# Patient Record
Sex: Male | Born: 1955
Health system: Southern US, Community
[De-identification: ages and names within clinical notes are randomized; demographics above are authoritative.]

## PROBLEM LIST (undated history)

## (undated) DIAGNOSIS — M1990 Unspecified osteoarthritis, unspecified site: Secondary | ICD-10-CM

## (undated) DIAGNOSIS — G4733 Obstructive sleep apnea (adult) (pediatric): Secondary | ICD-10-CM

## (undated) DIAGNOSIS — R55 Syncope and collapse: Secondary | ICD-10-CM

## (undated) DIAGNOSIS — E785 Hyperlipidemia, unspecified: Secondary | ICD-10-CM

## (undated) DIAGNOSIS — H5210 Myopia, unspecified eye: Secondary | ICD-10-CM

## (undated) DIAGNOSIS — Z950 Presence of cardiac pacemaker: Secondary | ICD-10-CM

## (undated) DIAGNOSIS — R001 Bradycardia, unspecified: Secondary | ICD-10-CM

## (undated) DIAGNOSIS — G4730 Sleep apnea, unspecified: Secondary | ICD-10-CM

## (undated) DIAGNOSIS — C801 Malignant (primary) neoplasm, unspecified: Secondary | ICD-10-CM

## (undated) HISTORY — DX: Syncope and collapse: R55

## (undated) HISTORY — PX: MENISCUS REPAIR: SHX5179

## (undated) HISTORY — DX: Obstructive sleep apnea (adult) (pediatric): G47.33

## (undated) HISTORY — PX: HERNIA REPAIR: SHX51

## (undated) HISTORY — DX: Bradycardia, unspecified: R00.1

## (undated) HISTORY — DX: Sleep apnea, unspecified: G47.30

## (undated) HISTORY — DX: Myopia, unspecified eye: H52.10

## (undated) HISTORY — DX: Hyperlipidemia, unspecified: E78.5

---

## 2003-01-01 DIAGNOSIS — R001 Bradycardia, unspecified: Secondary | ICD-10-CM

## 2003-01-01 HISTORY — PX: PACEMAKER PLACEMENT: SHX43

## 2003-01-01 HISTORY — DX: Bradycardia, unspecified: R00.1

## 2006-11-08 DIAGNOSIS — R55 Syncope and collapse: Secondary | ICD-10-CM

## 2006-11-08 HISTORY — DX: Syncope and collapse: R55

## 2006-11-08 HISTORY — PX: CARDIAC CATHETERIZATION: SHX172

## 2007-01-17 ENCOUNTER — Inpatient Hospital Stay (HOSPITAL_COMMUNITY): Admission: EM | Admit: 2007-01-17 | Discharge: 2007-01-19 | Payer: Self-pay | Admitting: Emergency Medicine

## 2007-01-17 ENCOUNTER — Ambulatory Visit: Payer: Self-pay | Admitting: Internal Medicine

## 2007-01-18 ENCOUNTER — Encounter (INDEPENDENT_AMBULATORY_CARE_PROVIDER_SITE_OTHER): Payer: Self-pay | Admitting: *Deleted

## 2007-01-18 ENCOUNTER — Ambulatory Visit: Payer: Self-pay | Admitting: Vascular Surgery

## 2008-02-16 ENCOUNTER — Emergency Department (HOSPITAL_COMMUNITY): Admission: EM | Admit: 2008-02-16 | Discharge: 2008-02-16 | Payer: Self-pay | Admitting: Emergency Medicine

## 2008-02-20 ENCOUNTER — Ambulatory Visit (HOSPITAL_COMMUNITY): Admission: RE | Admit: 2008-02-20 | Discharge: 2008-02-21 | Payer: Self-pay | Admitting: Ophthalmology

## 2010-07-08 ENCOUNTER — Ambulatory Visit: Payer: Self-pay | Admitting: Cardiology

## 2010-10-26 ENCOUNTER — Encounter: Payer: Self-pay | Admitting: Internal Medicine

## 2010-11-12 ENCOUNTER — Ambulatory Visit
Admission: RE | Admit: 2010-11-12 | Discharge: 2010-11-12 | Payer: Self-pay | Source: Home / Self Care | Attending: Internal Medicine | Admitting: Internal Medicine

## 2010-11-12 ENCOUNTER — Encounter: Payer: Self-pay | Admitting: Internal Medicine

## 2010-12-10 NOTE — Miscellaneous (Signed)
Summary: Device preload  Clinical Lists Changes  Observations: Added new observation of PPM INDICATN: Sick sinus syndrome (10/26/2010 12:45) Added new observation of MAGNET RTE: BOL 85 ERI 65 (10/26/2010 12:45) Added new observation of PPMLEADSTAT2: active (10/26/2010 12:45) Added new observation of PPMLEADSER2: [id number] (10/26/2010 12:45) Added new observation of PPMLEADMOD2: 4092  (10/26/2010 12:45) Added new observation of PPMLEADDOI2: 02/04/2003  (10/26/2010 12:45) Added new observation of PPMLEADLOC2: RV  (10/26/2010 12:45) Added new observation of PPMLEADSTAT1: active  (10/26/2010 12:45) Added new observation of PPMLEADSER1: [id number]  (10/26/2010 12:45) Added new observation of PPMLEADMOD1: 4592  (10/26/2010 12:45) Added new observation of PPMLEADDOI1: 02/04/2003  (10/26/2010 12:45) Added new observation of PPMLEADLOC1: RA  (10/26/2010 12:45) Added new observation of PPM DOI: 02/04/2003  (10/26/2010 12:45) Added new observation of PPM SERL#: [id number]  (10/26/2010 12:45) Added new observation of PPM MODL#: [id number]  (10/26/2010 46:96) Added new observation of PACEMAKERMFG: Medtronic  (10/26/2010 12:45) Added new observation of PPM REFER MD: Duffy Rhody Tennant,MD  (10/26/2010 12:45) Added new observation of PACEMAKER MD: Hillis Range, MD  (10/26/2010 12:45)      PPM Specifications Following MD:  Hillis Range, MD     Referring MD:  Rolla Plate PPM Vendor:  Medtronic     PPM Model Number:  [id number]     PPM Serial Number:  [id number] PPM DOI:  02/04/2003      Lead 1    Location: RA     DOI: 02/04/2003     Model #: [id number]     Serial #: [id number]     Status: active Lead 2    Location: RV     DOI: 02/04/2003     Model #: [id number]     Serial #: [id number]     Status: active  Magnet Response Rate:  BOL 85 ERI 65  Indications:  Sick sinus syndrome

## 2010-12-10 NOTE — Cardiovascular Report (Signed)
Summary: Office Visit   Office Visit   Imported By: Roderic Ovens 11/17/2010 14:05:25  _____________________________________________________________________  External Attachment:    Type:   Image     Comment:   External Document
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
## 2011-02-15 ENCOUNTER — Telehealth: Payer: Self-pay | Admitting: Internal Medicine

## 2011-02-15 MED ORDER — ATORVASTATIN CALCIUM 10 MG PO TABS: 10.0000 mg | ORAL_TABLET | Freq: Every day | ORAL | Status: DC

## 2011-03-03 ENCOUNTER — Encounter: Payer: Self-pay | Admitting: Internal Medicine

## 2011-03-04 ENCOUNTER — Ambulatory Visit (INDEPENDENT_AMBULATORY_CARE_PROVIDER_SITE_OTHER): Payer: 59 | Admitting: Internal Medicine

## 2011-03-04 ENCOUNTER — Encounter: Payer: Self-pay | Admitting: Internal Medicine

## 2011-03-04 VITALS — BP 126/72 | HR 68 | Resp 17 | Ht 72.0 in | Wt 212.0 lb

## 2011-03-04 DIAGNOSIS — R001 Bradycardia, unspecified: Secondary | ICD-10-CM | POA: Insufficient documentation

## 2011-03-04 DIAGNOSIS — I498 Other specified cardiac arrhythmias: Secondary | ICD-10-CM

## 2011-03-04 DIAGNOSIS — I495 Sick sinus syndrome: Secondary | ICD-10-CM

## 2011-03-04 DIAGNOSIS — R55 Syncope and collapse: Secondary | ICD-10-CM

## 2011-03-04 MED ORDER — METOPROLOL SUCCINATE ER 50 MG PO TB24: 50.0000 mg | ORAL_TABLET | Freq: Every day | ORAL | Status: DC

## 2011-03-04 MED ORDER — PAROXETINE HCL 20 MG PO TABS: 20.0000 mg | ORAL_TABLET | ORAL | Status: DC

## 2011-03-04 MED ORDER — ATORVASTATIN CALCIUM 10 MG PO TABS: 10.0000 mg | ORAL_TABLET | Freq: Every day | ORAL | Status: DC

## 2011-03-04 NOTE — Assessment & Plan Note (Signed)
Due to dysautonomia.  No recurrence since 2008.  No changes at this time.

## 2011-03-04 NOTE — Assessment & Plan Note (Signed)
Normal pacemaker function See Arita Miss Art report His battery longevity is estimated to be 6 months.  We will therefore schedule carelink in 3 months and then monthly thereafter.  I will see him again in 6 months. No changes today

## 2011-03-04 NOTE — Patient Instructions (Signed)
Your physician wants you to follow-up in: 6 months with Dr Jacquiline Doe will receive a reminder letter in the mail two months in advance. If you don't receive a letter, please call our office to schedule the follow-up appointment.  Remote monitoring is used to monitor your Pacemaker of ICD from home. This monitoring reduces the number of office visits required to check your device to one time per year. It allows [place] to keep an eye on the functioning of your device to ensure it is working properly. You are scheduled for a device check from home on 06/03/11. You may send your transmission at any time that day. If you have a wireless device, the transmission will be sent automatically. After your physician reviews your transmission, you will receive a postcard with your next transmission date.

## 2011-03-04 NOTE — Progress Notes (Signed)
Tracy [name] is a pleasant 55 y.o. yo patient with a h/o dysautonomia, syncope, and bradycardia sp PPM (MDT) 01/01/03  who presents today to establish care in the Electrophysiology device clinic.  He has previously had syncope.  This was initially felt to be due to bradycardia and he underwent PPM implantation 2004.  He did well until 2008 when he developed recurrent syncope.  He was evaluated by Dr Graciela Husbands at that time.  Cath was unrevealing and he was felt to have dysautonomia as the source for the episode.  His medicine was adjusted and he has done very well since.  Today, he  denies symptoms of palpitations, chest pain, shortness of breath, orthopnea, PND, lower extremity edema, dizziness, presyncope, syncope, or neurologic sequela.  The patientis tolerating medications without difficulties and is otherwise without complaint today.   Past Medical History  Diagnosis Date  . Syncope 2008    dysautonomia  . Myopia   . Dyslipidemia   . Bradycardia 01/01/03    s/p PPM (MDT)    Past Surgical History  Procedure Date  . Pacemaker placement 01/01/03  . Cardiac catheterization 2008    no CAD    History   Social History  . Marital Status: Married    Spouse Name: N/A    Number of Children: N/A  . Years of Education: N/A   Occupational History  . Not on file.   Social History Main Topics  . Smoking status: Never Smoker   . Smokeless tobacco: Not on file  . Alcohol Use: No  . Drug Use: No  . Sexually Active: Not on file   Other Topics Concern  . Not on file   Social History Narrative  . No narrative on file    No family history on file.  No Known Allergies  Current Outpatient Prescriptions  Medication Sig Dispense Refill  . atorvastatin (LIPITOR) 10 MG tablet Take 1 tablet (10 mg total) by mouth daily.  30 tablet  11  . loperamide (IMODIUM) 2 MG capsule Take 2 mg by mouth 4 (four) times daily as needed.        . metoprolol (TOPROL-XL) 50 MG 24 hr tablet Take 50 mg by mouth daily.         . Multiple Vitamin (MULTIVITAMIN) capsule Take 1 capsule by mouth daily.        Marland Kitchen PARoxetine (PAXIL) 20 MG tablet Take 20 mg by mouth every morning.        Marland Kitchen DISCONTD: budesonide-formoterol (SYMBICORT) 80-4.5 MCG/ACT inhaler Inhale 2 puffs into the lungs 2 (two) times daily.        Marland Kitchen DISCONTD: oxyCODONE-acetaminophen (PERCOCET) 5-325 MG per tablet Take 1 tablet by mouth every 4 (four) hours as needed.          ROS- all systems are reviewed and negative except as per HPI  Physical Exam: Filed Vitals:   03/04/11 1031  BP: 126/72  Pulse: 68  Resp: 17  Height: 6' (1.829 m)  Weight: 212 lb (96.163 kg)    GEN- The patient is well appearing, alert and oriented x 3 today.   Head- normocephalic, atraumatic Eyes-  Sclera clear, conjunctiva pink Ears- hearing intact Oropharynx- clear Neck- supple, no JVP Lymph- no cervical lymphadenopathy Lungs- Clear to ausculation bilaterally, normal work of breathing Chest- R sided pacemaker pocket is well healed Heart- Regular rate and rhythm, no murmurs, rubs or gallops, PMI not laterally displaced GI- soft, NT, ND, + BS Extremities- no clubbing, cyanosis, or edema MS-  no significant deformity or atrophy Skin- no rash or lesion Psych- euthymic mood, full affect Neuro- strength and sensation are intact  Pacemaker interrogation- reviewed in detail today,  See PACEART report  Assessment and Plan:

## 2011-03-08 ENCOUNTER — Telehealth: Payer: Self-pay | Admitting: Internal Medicine

## 2011-03-08 NOTE — Telephone Encounter (Signed)
Pt on lipitor 10 mg once a day, insurance won't pay it that way, needs to change to lipitor 20mg  half a tab a day-[facility] [phone]

## 2011-03-10 ENCOUNTER — Other Ambulatory Visit: Payer: Self-pay | Admitting: *Deleted

## 2011-03-10 MED ORDER — ATORVASTATIN CALCIUM 20 MG PO TABS: ORAL_TABLET | ORAL | Status: DC

## 2011-03-23 NOTE — Op Note (Signed)
NAME:  Tracy [name], Tracy [name] NO.:  [id number]   MEDICAL RECORD NO.:  [id number]          PATIENT TYPE:  OIB   LOCATION:  5123                         FACILITY:  [facility]   PHYSICIAN:  Beulah Gandy. Ashley Royalty, M.D. DATE OF BIRTH:  05/28/1956   DATE OF PROCEDURE:  02/20/2008  DATE OF DISCHARGE:                               OPERATIVE REPORT   ADMISSION DIAGNOSIS:  Rhegmatogenous retinal detachment, right eye.   PROCEDURES:  Scleral buckle and pars plana vitrectomy with the 25-gauge  system gas-fluid exchange, indirect ophthalmoscope laser, right eye.   SURGEON:  Beulah Gandy. Ashley Royalty, M.D.   ASSISTANT:  Rosalie Doctor, M.A.   ANESTHESIA:  General.   DETAILS:  Usual prep and drape.  360-degree limbal peritomy.  Isolation  of 4 rectus muscles on 2-0 silk.  Scleral dissection per 360 degrees to  admit #279 implant.  508G radial segment was placed beneath the break at  11 o'clock.  Mayer Camel was placed in the bed, 2 sutures per quadrant for  total of 8 sutures were placed in scleral flaps.  Black staphylomas were  seen temporarily and these areas were avoided.  A 240 band placed around  the globe with a 270 sleeve at 2 o'clock.  Perforation site shows in the  10 o'clock in the posterior aspects of the bed, a moderate amount of  clear colorless subretinal fluid came forth the scleral buckle.  Scleral  sutures were drawn securely.  The buckle was adjusted and trimmed.  The  band was adjusted and trimmed.  The 508G segment was placed.  Attention  was then carried to the pars plana area, where a 25-gauge trocars were  placed at 8, 10, and 2 o'clock.  Provisc was placed on the corneal  surface and the Biom viewing system was moved in to place.  Pars plana  vitrectomy was begun just behind the cataractous lens.  Blood clots and  clumps were engaged and removed with the vitreous cutter.  The  vitrectomy was carried out to the vitreous base with 360 degrees.  The  area of retinal detachment with the  large retinal break was trimmed.  Vitreous was trimmed from the flap and a portion of the flap was removed  with the vitreous cutter.  All vitreous blood was removed and vitreous  was vacuumed from the surface of the retina with a silicone tip suction  line.  The endolaser was placed in the eye, 241 burns were placed around  the retinal break with the power of 1000 mV, 1000 microns each, and 0.1  seconds each.  Once all the vitreous and blood was removed, a gas-fluid  exchange was carried out until all fluid and blood was removed from the  vitreous cavity.  Sterile room air was inflated.  The instruments and  trocars were removed from the eye.  The break was well supported on this  scleral buckle.  The indirect ophthalmoscopy laser was moved into place,  185 burns were placed on the back side of the retinal break, power was  800 mV, 1000 microns each, and 0.1 seconds each.  All sutures were  knotted and the free ends removed.  The band and buckle were trimmed.  The conjunctiva was reposited with 7-0 chromic suture.  Polymyxin and  gentamicin were irrigated into [place] space.  Atropine solution was  applied.  Marcaine was injected  around the globe for postop pain.  Decadron 10 mg was injected to the  lower subconjunctival space.  Atropine solution was applied.  TobraDex  ophthalmic ointment, a patch and shield were placed.  The patient was  awakened and taken to recovery in satisfactory condition.      Beulah Gandy. Ashley Royalty, M.D.  Electronically Signed     JDM/MEDQ  D:  02/20/2008  T:  02/21/2008  Job:  [id number]
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
## 2011-04-30 ENCOUNTER — Ambulatory Visit (HOSPITAL_BASED_OUTPATIENT_CLINIC_OR_DEPARTMENT_OTHER)
Admission: RE | Admit: 2011-04-30 | Discharge: 2011-04-30 | Disposition: A | Discharge status: Home / Self Care | Payer: 59 | Source: Ambulatory Visit | Attending: Orthopedic Surgery | Admitting: Orthopedic Surgery

## 2011-04-30 DIAGNOSIS — M2340 Loose body in knee, unspecified knee: Secondary | ICD-10-CM | POA: Insufficient documentation

## 2011-04-30 DIAGNOSIS — M2240 Chondromalacia patellae, unspecified knee: Secondary | ICD-10-CM | POA: Insufficient documentation

## 2011-04-30 DIAGNOSIS — Z01810 Encounter for preprocedural cardiovascular examination: Secondary | ICD-10-CM | POA: Insufficient documentation

## 2011-04-30 DIAGNOSIS — M23305 Other meniscus derangements, unspecified medial meniscus, unspecified knee: Secondary | ICD-10-CM | POA: Insufficient documentation

## 2011-04-30 LAB — POCT I-STAT, CHEM 8
BUN: 14 mg/dL (ref 6–23)
Calcium, Ion: 1.09 mmol/L — ABNORMAL LOW (ref 1.12–1.32)
Chloride: 102 mEq/L (ref 96–112)
Creatinine, Ser: 0.9 mg/dL (ref 0.50–1.35)
Glucose, Bld: 122 mg/dL — ABNORMAL HIGH (ref 70–99)
HCT: 45 % (ref 39.0–52.0)
Hemoglobin: 15.3 g/dL (ref 13.0–17.0)
Potassium: 3.8 mEq/L (ref 3.5–5.1)
Sodium: 139 mEq/L (ref 135–145)
TCO2: 27 mmol/L (ref 0–100)

## 2011-05-26 NOTE — Op Note (Signed)
NAME:  ABDALLA, NARAMORE NO.:  [id number]  MEDICAL RECORD NO.:  [id number]  LOCATION:                                 FACILITY:  PHYSICIAN:  Harvie Junior, M.D.        DATE OF BIRTH:  DATE OF PROCEDURE:  04/30/2011 DATE OF DISCHARGE:                              OPERATIVE REPORT   PREOPERATIVE DIAGNOSIS:  Medial meniscal tear.  POSTOPERATIVE DIAGNOSES: 1. Medial meniscal tear. 2. Chondromalacia, medial compartment. 3. Chondromalacia, patellofemoral compartment. 4. Multiple cartilaginous loose bodies.  PRINCIPAL PROCEDURE: 1. Operative knee arthroscopy with partial medial meniscectomy with     corresponding debridement in the medial compartment of grade 3 and     grade 4 change on the medial femoral condyle and medial tibial     plateau. 2. Debridement of chondromalacia patellofemoral joint down the     bleeding bone. 3. Removal of multiple cartilaginous loose bodies.  SURGEON:  Harvie Junior, MD.  ASSISTANT:  Marshia Ly, PA.  ANESTHESIA:  General.  BRIEF HISTORY:  Mr. Flatt is a 55 year old male, otherwise healthy, who was having significant complaints of left knee pain.  We have treated him conservatively for a period of time with injection therapy, activity modification, because of failure of all conservative care, he was taken to the operating room for operative knee arthroscopy.  DESCRIPTION OF PROCEDURE:  The patient was taken to the operating room. After adequate anesthesia was obtained with general anesthetic, the patient was placed supine on the operating table.  Left leg was prepped and draped in usual sterile fashion.  Following routine arthroscopic examination of the knee revealed, there was an obvious medial meniscal tear in the midbody and around posteriorly.  This was debrided back to a smooth and stable rim.  There was grade 4 change on the tibial plateau and only a several millimeter area which was debrided.  There  was significant chondromalacia in the medial femoral condyle.  During the case, multiple cartilaginous loose bodies were identified in the knee and these were removed with suction shaver throughout the case.  Once the medial compartment had been cleaned up, attention was turned to the ACL normal lateral side that had no evidence of meniscal pathology or chondral pathology, but there was significant cartilaginous loose bodies in the knee, we suctioned out here as well.  Attention was turned back to patellofemoral joint, which was debrided thoroughly, but down to bleeding bone and some areas of significant chondromalacia in this area, so I went up into the pouch and put 6 liters of normal saline irrigation through the knee to irrigate it out fully.  Once this was done, the knee was copious and thoroughly lavage suctioned dry.  All subportals closed with bandage.  Sterile compression was applied after 20 mL of 0.25% Marcaine had been instilled in the knee for postoperative anesthesia.  Once the bandage was applied, the patient was taken to recovery room and noted to be in satisfactory condition. Estimated blood loss of procedure none.     Harvie Junior, M.D.     Ranae Plumber  D:  04/30/2011  T:  05/01/2011  Job:  [id number]  Electronically Signed by Jodi Geralds M.D. on 05/26/2011 09:06:10 PM

## 2011-06-03 ENCOUNTER — Encounter: Payer: 59 | Admitting: *Deleted

## 2011-06-08 ENCOUNTER — Encounter: Payer: Self-pay | Admitting: *Deleted

## 2011-07-10 DIAGNOSIS — G4730 Sleep apnea, unspecified: Secondary | ICD-10-CM

## 2011-07-10 HISTORY — DX: Sleep apnea, unspecified: G47.30

## 2011-07-13 ENCOUNTER — Encounter: Payer: Self-pay | Admitting: Internal Medicine

## 2011-07-13 ENCOUNTER — Ambulatory Visit (INDEPENDENT_AMBULATORY_CARE_PROVIDER_SITE_OTHER): Payer: 59 | Admitting: *Deleted

## 2011-07-13 ENCOUNTER — Other Ambulatory Visit: Payer: Self-pay

## 2011-07-13 DIAGNOSIS — R001 Bradycardia, unspecified: Secondary | ICD-10-CM

## 2011-07-13 DIAGNOSIS — R55 Syncope and collapse: Secondary | ICD-10-CM

## 2011-07-13 DIAGNOSIS — I498 Other specified cardiac arrhythmias: Secondary | ICD-10-CM

## 2011-07-16 LAB — REMOTE PACEMAKER DEVICE
BATTERY VOLTAGE: 2.59 V
BMOD-0001RV: LOW
BMOD-0003RV: 30
BMOD-0005RV: 95 {beats}/min
BRDY-0002RV: 65 {beats}/min
RV LEAD IMPEDENCE PM: 666 Ohm
VENTRICULAR PACING PM: 1

## 2011-07-21 ENCOUNTER — Encounter: Payer: Self-pay | Admitting: Internal Medicine

## 2011-07-21 ENCOUNTER — Ambulatory Visit (INDEPENDENT_AMBULATORY_CARE_PROVIDER_SITE_OTHER): Payer: 59 | Admitting: Internal Medicine

## 2011-07-21 ENCOUNTER — Encounter: Payer: Self-pay | Admitting: *Deleted

## 2011-07-21 VITALS — BP 132/80 | HR 80 | Ht 72.0 in | Wt 214.0 lb

## 2011-07-21 DIAGNOSIS — I4891 Unspecified atrial fibrillation: Secondary | ICD-10-CM

## 2011-07-21 DIAGNOSIS — G4733 Obstructive sleep apnea (adult) (pediatric): Secondary | ICD-10-CM | POA: Insufficient documentation

## 2011-07-21 DIAGNOSIS — G4730 Sleep apnea, unspecified: Secondary | ICD-10-CM

## 2011-07-21 HISTORY — DX: Obstructive sleep apnea (adult) (pediatric): G47.33

## 2011-07-21 LAB — CBC WITH DIFFERENTIAL/PLATELET
Basophils Absolute: 0 10*3/uL (ref 0.0–0.1)
Basophils Relative: 0.4 % (ref 0.0–3.0)
Eosinophils Absolute: 0.1 10*3/uL (ref 0.0–0.7)
Eosinophils Relative: 1.6 % (ref 0.0–5.0)
HCT: 39.8 % (ref 39.0–52.0)
Hemoglobin: 13.1 g/dL (ref 13.0–17.0)
Lymphocytes Relative: 18.8 % (ref 12.0–46.0)
Lymphs Abs: 1.5 10*3/uL (ref 0.7–4.0)
MCHC: 33 g/dL (ref 30.0–36.0)
MCV: 100.3 fl — ABNORMAL HIGH (ref 78.0–100.0)
Monocytes Absolute: 0.5 10*3/uL (ref 0.1–1.0)
Monocytes Relative: 6.7 % (ref 3.0–12.0)
Neutro Abs: 5.9 10*3/uL (ref 1.4–7.7)
Neutrophils Relative %: 72.5 % (ref 43.0–77.0)
Platelets: 219 10*3/uL (ref 150.0–400.0)
RBC: 3.97 Mil/uL — ABNORMAL LOW (ref 4.22–5.81)
RDW: 13.3 % (ref 11.5–14.6)
WBC: 8.1 10*3/uL (ref 4.5–10.5)

## 2011-07-21 LAB — BASIC METABOLIC PANEL
BUN: 13 mg/dL (ref 6–23)
CO2: 30 mEq/L (ref 19–32)
Calcium: 8.8 mg/dL (ref 8.4–10.5)
Chloride: 102 mEq/L (ref 96–112)
Creatinine, Ser: 1 mg/dL (ref 0.4–1.5)
GFR: 84.51 mL/min (ref >60.00)
Glucose, Bld: 148 mg/dL — ABNORMAL HIGH (ref 70–99)
Potassium: 3.8 mEq/L (ref 3.5–5.1)
Sodium: 141 mEq/L (ref 135–145)

## 2011-07-21 NOTE — Assessment & Plan Note (Signed)
He states that he occasionally wakes with abrupt SOB and feels that he "quits breathing" during his sleep. WIll order a sleep study.

## 2011-07-21 NOTE — Assessment & Plan Note (Signed)
No recurrence. 

## 2011-07-21 NOTE — Assessment & Plan Note (Signed)
He has reached ERI battery status. Risks, benefits, and alternatives to PPM pulse generator replacement were discussed at length with the patient today.  Understands the risks and wishes to proceed.  We will therefore schedule PPM Generator change at the next available time.

## 2011-07-21 NOTE — Progress Notes (Signed)
Tracy [name] is a pleasant 55 y.o. WM patient with a h/o dysautonomia, syncope, and bradycardia sp PPM (MDT) 01/01/03  who presents for follow-up in the Electrophysiology device clinic.  He has previously had syncope.  This was initially felt to be due to bradycardia and he underwent PPM implantation 2004.  He did well until 2008 when he developed recurrent syncope.  He was evaluated by Dr Graciela Husbands at that time.  Cath was unrevealing and he was felt to have dysautonomia as the source for the episode.  His medicine was adjusted and he has done very well since.  Today, he  denies symptoms of palpitations, chest pain, shortness of breath, orthopnea, PND, lower extremity edema, dizziness, presyncope, syncope, or neurologic sequela.  His pacemaker has reached ERI battery status.  He reports mild fatigue since that time. The patientis tolerating medications without difficulties and is otherwise without complaint today.   Past Medical History  Diagnosis Date  . Syncope 2008    dysautonomia  . Myopia   . Dyslipidemia   . Bradycardia 01/01/03    s/p PPM (MDT)    Past Surgical History  Procedure Date  . Pacemaker placement 01/01/03  . Cardiac catheterization 2008    no CAD    History   Social History  . Marital Status: Married    Spouse Name: N/A    Number of Children: N/A  . Years of Education: N/A   Occupational History  . Not on file.   Social History Main Topics  . Smoking status: Never Smoker   . Smokeless tobacco: Not on file  . Alcohol Use: No  . Drug Use: No  . Sexually Active: Not on file   Other Topics Concern  . Not on file   Social History Narrative  . No narrative on file    No family history on file.  No Known Allergies  Current Outpatient Prescriptions  Medication Sig Dispense Refill  . atorvastatin (LIPITOR) 10 MG tablet Take 1 tablet (10 mg total) by mouth daily.  30 tablet  11  . loperamide (IMODIUM) 2 MG capsule Take 2 mg by mouth 4 (four) times daily as needed.         . metoprolol (TOPROL-XL) 50 MG 24 hr tablet Take 1 tablet (50 mg total) by mouth daily.  30 tablet  11  . Multiple Vitamin (MULTIVITAMIN) capsule Take 1 capsule by mouth daily.        Marland Kitchen PARoxetine (PAXIL) 20 MG tablet Take 1 tablet (20 mg total) by mouth every morning.  30 tablet  11    ROS- all systems are reviewed and negative except as per HPI  Physical Exam: Filed Vitals:   07/21/11 1208  BP: 132/80  Pulse: 80  Height: 6' (1.829 m)  Weight: 214 lb (97.07 kg)    GEN- The patient is well appearing, alert and oriented x 3 today.   Head- normocephalic, atraumatic Eyes-  Sclera clear, conjunctiva pink Ears- hearing intact Oropharynx- clear Neck- supple, no JVP Lymph- no cervical lymphadenopathy Lungs- Clear to ausculation bilaterally, normal work of breathing Chest- R sided pacemaker pocket is well healed Heart- Regular rate and rhythm, no murmurs, rubs or gallops, PMI not laterally displaced GI- soft, NT, ND, + BS Extremities- no clubbing, cyanosis, or edema MS- no significant deformity or atrophy Skin- no rash or lesion Psych- euthymic mood, full affect Neuro- strength and sensation are intact  Pacemaker interrogation- reviewed in detail today,  See PACEART report  Assessment and Plan:

## 2011-07-26 NOTE — Progress Notes (Signed)
Pacer remote check  

## 2011-07-27 ENCOUNTER — Other Ambulatory Visit: Payer: Self-pay | Admitting: Internal Medicine

## 2011-07-27 ENCOUNTER — Ambulatory Visit (HOSPITAL_COMMUNITY)
Admission: RE | Admit: 2011-07-27 | Discharge: 2011-07-27 | Disposition: A | Discharge status: Home / Self Care | Payer: 59 | Source: Ambulatory Visit | Attending: Internal Medicine | Admitting: Internal Medicine

## 2011-07-27 DIAGNOSIS — Z01811 Encounter for preprocedural respiratory examination: Secondary | ICD-10-CM

## 2011-07-27 DIAGNOSIS — I495 Sick sinus syndrome: Secondary | ICD-10-CM

## 2011-07-27 DIAGNOSIS — Z01812 Encounter for preprocedural laboratory examination: Secondary | ICD-10-CM | POA: Insufficient documentation

## 2011-07-27 DIAGNOSIS — Z01810 Encounter for preprocedural cardiovascular examination: Secondary | ICD-10-CM | POA: Insufficient documentation

## 2011-07-27 DIAGNOSIS — Z01818 Encounter for other preprocedural examination: Secondary | ICD-10-CM | POA: Insufficient documentation

## 2011-07-27 DIAGNOSIS — Z45018 Encounter for adjustment and management of other part of cardiac pacemaker: Secondary | ICD-10-CM | POA: Insufficient documentation

## 2011-07-27 LAB — PROTIME-INR
INR: 0.95 (ref 0.00–1.49)
Prothrombin Time: 12.9 seconds (ref 11.6–15.2)

## 2011-07-27 LAB — SURGICAL PCR SCREEN
MRSA, PCR: NEGATIVE
Staphylococcus aureus: POSITIVE — AB

## 2011-07-27 LAB — APTT: aPTT: 30 seconds (ref 24–37)

## 2011-07-28 ENCOUNTER — Encounter: Payer: Self-pay | Admitting: *Deleted

## 2011-07-28 ENCOUNTER — Telehealth: Payer: Self-pay | Admitting: Internal Medicine

## 2011-07-28 NOTE — Telephone Encounter (Signed)
Pt wanting to letter email to him stating that pt cannot return to work until next week.  [email or web address]  Please return call to pt to discuss further.

## 2011-07-28 NOTE — Telephone Encounter (Signed)
Pt calling wanting a letter to work stating pt work restrictions. Pt would also like a note from MD that pt can stay out of work until the end of the week. Pt said if he goes to work there is no way to abide by the recommended restrictions. 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 Please return pt call to clarify/discuss further.

## 2011-07-28 NOTE — Telephone Encounter (Signed)
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  Needs note to be out of work through 08/02/11.  Also to say no lifting over 10lbs for the next 2 weeks  Fax [phone]  Atn: Candi Leash Heiser

## 2011-07-29 ENCOUNTER — Ambulatory Visit (HOSPITAL_BASED_OUTPATIENT_CLINIC_OR_DEPARTMENT_OTHER): Payer: 59 | Attending: Internal Medicine

## 2011-07-29 ENCOUNTER — Encounter: Payer: Self-pay | Admitting: *Deleted

## 2011-07-29 DIAGNOSIS — G4733 Obstructive sleep apnea (adult) (pediatric): Secondary | ICD-10-CM | POA: Insufficient documentation

## 2011-07-29 DIAGNOSIS — I4891 Unspecified atrial fibrillation: Secondary | ICD-10-CM

## 2011-07-29 DIAGNOSIS — G4737 Central sleep apnea in conditions classified elsewhere: Secondary | ICD-10-CM | POA: Insufficient documentation

## 2011-07-29 DIAGNOSIS — I4949 Other premature depolarization: Secondary | ICD-10-CM | POA: Insufficient documentation

## 2011-07-30 ENCOUNTER — Encounter: Payer: Self-pay | Admitting: *Deleted

## 2011-08-03 LAB — DIFFERENTIAL
Basophils Absolute: 0
Basophils Relative: 0
Eosinophils Absolute: 0.4
Eosinophils Relative: 6 — ABNORMAL HIGH
Lymphocytes Relative: 27
Lymphs Abs: 1.7
Monocytes Absolute: 0.5
Monocytes Relative: 8
Neutro Abs: 3.8
Neutrophils Relative %: 59

## 2011-08-03 LAB — CBC
HCT: 40.4
HCT: 41.9
Hemoglobin: 13.7
Hemoglobin: 14.5
MCHC: 34
MCHC: 34.5
MCV: 95.2
MCV: 95.8
Platelets: 233
Platelets: 238
RBC: 4.22
RBC: 4.4
RDW: 12.5
RDW: 12.7
WBC: 6.2
WBC: 6.4

## 2011-08-03 LAB — POCT I-STAT, CHEM 8
BUN: 18
Calcium, Ion: 1.14
Chloride: 103
Creatinine, Ser: 1
Glucose, Bld: 110 — ABNORMAL HIGH
HCT: 43
Hemoglobin: 14.6
Potassium: 4
Sodium: 138
TCO2: 27

## 2011-08-03 LAB — BASIC METABOLIC PANEL
BUN: 10
CO2: 28
Calcium: 9.6
Chloride: 103
Creatinine, Ser: 0.78
GFR calc Af Amer: >60
GFR calc non Af Amer: >60
Glucose, Bld: 108 — ABNORMAL HIGH
Potassium: 4.6
Sodium: 140

## 2011-08-04 ENCOUNTER — Ambulatory Visit (INDEPENDENT_AMBULATORY_CARE_PROVIDER_SITE_OTHER): Payer: 59 | Admitting: *Deleted

## 2011-08-04 ENCOUNTER — Encounter: Payer: Self-pay | Admitting: Internal Medicine

## 2011-08-04 DIAGNOSIS — R001 Bradycardia, unspecified: Secondary | ICD-10-CM

## 2011-08-04 DIAGNOSIS — R55 Syncope and collapse: Secondary | ICD-10-CM

## 2011-08-04 DIAGNOSIS — I498 Other specified cardiac arrhythmias: Secondary | ICD-10-CM

## 2011-08-04 LAB — PACEMAKER DEVICE OBSERVATION
AL AMPLITUDE: 4 mv
AL IMPEDENCE PM: 557 Ohm
AL THRESHOLD: 0.5 V
ATRIAL PACING PM: 5
BATTERY VOLTAGE: 2.8 V
RV LEAD AMPLITUDE: 11.2 mv
RV LEAD IMPEDENCE PM: 615 Ohm
RV LEAD THRESHOLD: 1 V
VENTRICULAR PACING PM: 5

## 2011-08-04 NOTE — Progress Notes (Signed)
Wound check-PPM 

## 2011-08-05 ENCOUNTER — Ambulatory Visit: Payer: 59 | Admitting: *Deleted

## 2011-08-07 DIAGNOSIS — G4733 Obstructive sleep apnea (adult) (pediatric): Secondary | ICD-10-CM

## 2011-08-07 DIAGNOSIS — G4737 Central sleep apnea in conditions classified elsewhere: Secondary | ICD-10-CM

## 2011-08-07 NOTE — Procedures (Signed)
NAME:  Tracy [name], Tracy [name] NO.:  [id number]  MEDICAL RECORD NO.:  [id number]          PATIENT TYPE:  OUT  LOCATION:  SLEEP CENTER                 FACILITY:  [facility]  PHYSICIAN:  Barbaraann Share, MD,FCCPDATE OF BIRTH:  09/24/1956  DATE OF STUDY:  07/29/2011                           NOCTURNAL POLYSOMNOGRAM  REFERRING PHYSICIAN:  Hillis Range, MD  INDICATION FOR STUDY:  Hypersomnia with sleep apnea.  EPWORTH SLEEPINESS SCORE:  Epworth score:  16.  MEDICATIONS:  SLEEP ARCHITECTURE:  The patient had a total sleep time of 349 minutes with no slow wave sleep and adequate REM only during the titration portion of the study.  Sleep onset latency was normal at 8 minutes, and REM was not achieved during the diagnostic portion of the study.  Sleep efficiency was 83% during the diagnostic portion and 90% during the titration portion.  RESPIRATORY DATA:  The patient underwent a split night protocol where he was found to have 197 obstructive and central events in the first 135 minutes of sleep.  This gave him an apnea/hypopnea index of 88 events per hour during the diagnostic portion of the study.  The patient's events occurred in all body positions and there was very loud snoring noted throughout.  By protocol, he was fitted with a medium ResMed Quattro full-face mask, and CPAP titration was initiated.  At an optimal pressure of 16 CWP, the patient appeared to have good control of his events and also snoring.  OXYGEN DATA:  There was O2 desaturation as low as 70% with the patient's obstructive events.  At the optimal CPAP, the patient had no significant desaturation.  CARDIAC DATA:  Frequent PVCs noted.  MOVEMENT-PARASOMNIA:  The patient was found to have large numbers of leg jerks but very little awakening or arousal.  These appeared to be concentrated during one particular time period, and did not appear to be related to a primary movement disorder of  sleep.  IMPRESSIONS-RECOMMENDATIONS: 1. Split night study reveals severe obstructive and central sleep     apnea with an AHI of 88 events per hour and O2 desaturation as low     as 70% during the diagnostic portion of the study.  The patient was     then fitted with a medium ResMed Quattro full-face mask, and had     excellent control of his obstructive events with a pressure of     16 CWP.  The patient should also be encouraged to work on modest     weight loss. 2. Frequent PVCs noted.     Barbaraann Share, MD,FCCP Diplomate, American Board of Sleep Medicine Electronically Signed    KMC/MEDQ  D:  08/07/2011 13:32:28  T:  08/07/2011 13:49:11  Job:  [id number]

## 2011-08-12 NOTE — Progress Notes (Signed)
Addended by: Dennis Bast F on: 08/12/2011 01:18 PM   Modules accepted: Orders

## 2011-08-12 NOTE — Op Note (Signed)
NAME:  Tracy [name], BOSSMAN NO.:  [id number]  MEDICAL RECORD NO.:  [id number]  LOCATION:                                 FACILITY:  PHYSICIAN:  Hillis Range, MD       DATE OF BIRTH:  06-15-1956  DATE OF PROCEDURE: DATE OF DISCHARGE:                              OPERATIVE REPORT   SURGEON:  Hillis Range, MD  PREPROCEDURE DIAGNOSES: 1. Sick sinus syndrome with syncope. 2. Pacemaker at elective replacement indicator.  POSTPROCEDURE DIAGNOSES: 1. Sick sinus syndrome with syncope. 2. Pacemaker at elective replacement indicator.  PROCEDURES:  Pacemaker pulse generator replacement with skin pocket revision.  INTRODUCTION:  Mr. Age is a pleasant 55 year old gentleman with a history of dysautonomia, sick sinus syndrome, and syncope.  He is status post pacemaker implantation in 2004 following recurrent syncope.  He has done very well since his pacemaker was implanted.  Recently, he has reached ERI battery status.  He therefore presents today for pacemaker pulse generator replacement.  DESCRIPTION OF PROCEDURE:  Informed written consent was obtained, and the patient was brought to the electrophysiology lab in the fasting state.  His pacemaker was interrogated and confirmed to be at [facility] battery status.  He was adequately sedated with intravenous Versed as outlined in the nursing report.  The patient's right chest was prepped and draped in the usual sterile fashion by the EP lab staff.  The skin overlying his existing pacemaker was infiltrated with lidocaine for local analgesia.  A 4-cm incision was made over the existing pacemaker pocket.  Using a combination of sharp and blunt dissection, the pacemaker was exposed and removed from the body.  There was no foreign matter or debris within the pocket.  The pacemaker was disconnected from the leads.  The atrial lead was confirmed to be a Medtronic model 4592- 45 (serial number [id number]) lead implanted on February 04, 2003.  The right ventricular lead was confirmed to be a Medtronic model [id number] (serial number [id number]) lead also implanted on February 04, 2003.  Both leads were examined thoroughly and their integrity was confirmed to be intact.  Atrial lead P-waves measured 5 mV with an impedance of 480 ohms and a threshold of 0.4 V at 0.5 msec.  Right ventricular lead R-waves measured 9 mV with an impedance of 539 ohms and a threshold of 0.8 V at 0.5 msec.  Both leads were connected to a Medtronic Adapta L, model ADDRL1 (serial number [id number]) pacemaker.  The pocket was revised to accommodate this new device.  Electrocautery was required to assure hemostasis.  The pacemaker was then placed into the pocket.  The pocket was then closed in 2 layers with 2.0 Vicryl suture for the subcutaneous and subcuticular layers.  Steri-Strips and a sterile dressing were then applied.  There were no early apparent complications.  CONCLUSIONS: 1. Successful pacemaker pulse generator replacement for  elective     replacement indicator battery status. 2. No early apparent complications.     Hillis Range, MD     JA/MEDQ  D:  07/27/2011  T:  07/27/2011  Job:  [id number]  Electronically Signed by Hillis Range MD on 08/12/2011 08:44:23 AM

## 2011-08-13 ENCOUNTER — Telehealth: Payer: Self-pay | Admitting: Internal Medicine

## 2011-08-13 NOTE — Telephone Encounter (Addendum)
Walk In Pt Form " Pt Dropped off [employer] Papers to be completed" Placed in Allred's Box for Completion Tresa Endo off Friday 08/13/11)   08/13/11/km  Dr.Allred completed [employer] papers for PT called pt for pick up  08/24/11/km

## 2011-08-16 ENCOUNTER — Ambulatory Visit: Payer: 59 | Admitting: Internal Medicine

## 2011-10-26 ENCOUNTER — Encounter: Payer: 59 | Admitting: Internal Medicine

## 2011-11-04 ENCOUNTER — Encounter: Payer: 59 | Admitting: Internal Medicine

## 2011-12-02 ENCOUNTER — Encounter: Payer: Self-pay | Admitting: *Deleted

## 2011-12-15 ENCOUNTER — Ambulatory Visit (INDEPENDENT_AMBULATORY_CARE_PROVIDER_SITE_OTHER): Payer: 59 | Admitting: *Deleted

## 2011-12-15 ENCOUNTER — Encounter: Payer: Self-pay | Admitting: Internal Medicine

## 2011-12-15 DIAGNOSIS — I498 Other specified cardiac arrhythmias: Secondary | ICD-10-CM

## 2011-12-15 DIAGNOSIS — R001 Bradycardia, unspecified: Secondary | ICD-10-CM

## 2011-12-19 LAB — REMOTE PACEMAKER DEVICE
AL AMPLITUDE: 2.8 mv
AL IMPEDENCE PM: 572 Ohm
ATRIAL PACING PM: 8
BATTERY VOLTAGE: 2.8 V
BRDY-0002RV: 40 {beats}/min
RV LEAD AMPLITUDE: 16 mv
RV LEAD IMPEDENCE PM: 671 Ohm
RV LEAD THRESHOLD: 0.625 V
VENTRICULAR PACING PM: 8

## 2011-12-24 NOTE — Progress Notes (Signed)
Pacer remote 

## 2011-12-27 ENCOUNTER — Encounter: Payer: Self-pay | Admitting: *Deleted

## 2012-01-07 ENCOUNTER — Encounter: Payer: Self-pay | Admitting: Internal Medicine

## 2012-01-07 ENCOUNTER — Ambulatory Visit (INDEPENDENT_AMBULATORY_CARE_PROVIDER_SITE_OTHER): Payer: 59 | Admitting: Internal Medicine

## 2012-01-07 DIAGNOSIS — R001 Bradycardia, unspecified: Secondary | ICD-10-CM

## 2012-01-07 DIAGNOSIS — R0681 Apnea, not elsewhere classified: Secondary | ICD-10-CM

## 2012-01-07 DIAGNOSIS — G4730 Sleep apnea, unspecified: Secondary | ICD-10-CM

## 2012-01-07 DIAGNOSIS — I498 Other specified cardiac arrhythmias: Secondary | ICD-10-CM

## 2012-01-07 DIAGNOSIS — R55 Syncope and collapse: Secondary | ICD-10-CM

## 2012-01-07 LAB — PACEMAKER DEVICE OBSERVATION
AL AMPLITUDE: 5.6 mv
AL IMPEDENCE PM: 537 Ohm
AL THRESHOLD: 0.5 V
ATRIAL PACING PM: 7
BATTERY VOLTAGE: 2.79 V
RV LEAD AMPLITUDE: 15.68 mv
RV LEAD IMPEDENCE PM: 657 Ohm
RV LEAD THRESHOLD: 0.75 V
VENTRICULAR PACING PM: 7

## 2012-01-07 MED ORDER — ATENOLOL 25 MG PO TABS: 25.0000 mg | ORAL_TABLET | Freq: Every day | ORAL | Status: DC

## 2012-01-07 NOTE — Patient Instructions (Addendum)
Your physician has requested that you have an echocardiogram. Echocardiography is a painless test that uses sound waves to create images of your heart. It provides your doctor with information about the size and shape of your heart and how well your heart's chambers and valves are working. This procedure takes approximately one hour. There are no restrictions for this procedure.  Your physician wants you to follow-up in: 1 year.  You will receive a reminder letter in the mail two months in advance. If you don't receive a letter, please call our office to schedule the follow-up appointment.  Your physician has recommended you make the following change in your medication: Atenolol 25mg  daily.  You have been referred to Dr. Marcelyn Bruins, MD-Pulmonologist

## 2012-01-09 ENCOUNTER — Encounter: Payer: Self-pay | Admitting: Internal Medicine

## 2012-01-09 NOTE — Progress Notes (Signed)
The patient presents today for routine electrophysiology followup.  Since his recent generator change, the patient reports doing very well.  Today, he denies symptoms of palpitations, chest pain, shortness of breath, orthopnea, PND, lower extremity edema, dizziness, presyncope, syncope, or neurologic sequela.  The patient feels that he is tolerating medications without difficulties and is otherwise without complaint today.   Past Medical History  Diagnosis Date  . Syncope 2008    dysautonomia  . Myopia   . Dyslipidemia   . Bradycardia 01/01/03    s/p PPM (MDT)   Past Surgical History  Procedure Date  . Pacemaker placement 01/01/03    generator change (MDT) by Dr Johney Frame 2012  . Cardiac catheterization 2008    no CAD    Current Outpatient Prescriptions  Medication Sig Dispense Refill  . atorvastatin (LIPITOR) 10 MG tablet Take 1 tablet (10 mg total) by mouth daily.  30 tablet  11  . loperamide (IMODIUM) 2 MG capsule Take 2 mg by mouth 4 (four) times daily as needed.        . metoprolol (TOPROL-XL) 50 MG 24 hr tablet Take 1 tablet (50 mg total) by mouth daily.  30 tablet  11  . Multiple Vitamin (MULTIVITAMIN) capsule Take 1 capsule by mouth daily.        Marland Kitchen PARoxetine (PAXIL) 20 MG tablet Take 1 tablet (20 mg total) by mouth every morning.  30 tablet  11  . atenolol (TENORMIN) 25 MG tablet Take 1 tablet (25 mg total) by mouth daily.  30 tablet  11    No Known Allergies  History   Social History  . Marital Status: Married    Spouse Name: N/A    Number of Children: N/A  . Years of Education: N/A   Occupational History  . Not on file.   Social History Main Topics  . Smoking status: Never Smoker   . Smokeless tobacco: Not on file  . Alcohol Use: No  . Drug Use: No  . Sexually Active: Not on file   Other Topics Concern  . Not on file   Social History Narrative  . No narrative on file    Physical Exam: Filed Vitals:   01/07/12 1503  BP: 136/80  Pulse: 77  Resp: 18    Height: 6' (1.829 m)  Weight: 217 lb 12.8 oz (98.793 kg)    GEN- The patient is well appearing, alert and oriented x 3 today.   Head- normocephalic, atraumatic Eyes-  Sclera clear, conjunctiva pink Ears- hearing intact Oropharynx- clear Neck- supple, no JVP Lymph- no cervical lymphadenopathy Lungs- Clear to ausculation bilaterally, normal work of breathing Chest- pacemaker pocket is well healed Heart- Regular rate and rhythm, no murmurs, rubs or gallops, PMI not laterally displaced GI- soft, NT, ND, + BS Extremities- no clubbing, cyanosis, or edema  Pacemaker interrogation- reviewed in detail today,  See PACEART report  Assessment and Plan:

## 2012-01-09 NOTE — Assessment & Plan Note (Signed)
Normal pacemaker function See Pace Art report No changes today  

## 2012-01-09 NOTE — Assessment & Plan Note (Signed)
Recent sleep study reveals severe sleep apnea Will refer to Dr Shelle Iron  Echo to evaluate for pulmonary HTN

## 2012-01-19 ENCOUNTER — Ambulatory Visit (HOSPITAL_COMMUNITY): Payer: 59 | Attending: Internal Medicine

## 2012-01-25 ENCOUNTER — Institutional Professional Consult (permissible substitution): Payer: 59 | Admitting: Pulmonary Disease

## 2012-01-25 ENCOUNTER — Telehealth: Payer: Self-pay | Admitting: Internal Medicine

## 2012-01-25 NOTE — Telephone Encounter (Signed)
Walk in pt Form " Pt Dropped off DMV papers to be completed" sent to [facility] 01/25/12/KM

## 2012-02-01 ENCOUNTER — Ambulatory Visit (HOSPITAL_COMMUNITY): Payer: 59 | Attending: Cardiology

## 2012-02-01 ENCOUNTER — Other Ambulatory Visit: Payer: Self-pay

## 2012-02-01 DIAGNOSIS — R001 Bradycardia, unspecified: Secondary | ICD-10-CM

## 2012-02-01 DIAGNOSIS — G4730 Sleep apnea, unspecified: Secondary | ICD-10-CM | POA: Insufficient documentation

## 2012-02-01 DIAGNOSIS — I495 Sick sinus syndrome: Secondary | ICD-10-CM | POA: Insufficient documentation

## 2012-02-01 DIAGNOSIS — R55 Syncope and collapse: Secondary | ICD-10-CM | POA: Insufficient documentation

## 2012-02-28 ENCOUNTER — Other Ambulatory Visit: Payer: Self-pay | Admitting: Internal Medicine

## 2012-02-28 DIAGNOSIS — R55 Syncope and collapse: Secondary | ICD-10-CM

## 2012-02-28 DIAGNOSIS — R001 Bradycardia, unspecified: Secondary | ICD-10-CM

## 2012-02-28 DIAGNOSIS — I495 Sick sinus syndrome: Secondary | ICD-10-CM

## 2012-02-28 MED ORDER — METOPROLOL SUCCINATE ER 50 MG PO TB24: 50.0000 mg | ORAL_TABLET | Freq: Every day | ORAL | Status: DC

## 2012-02-29 ENCOUNTER — Telehealth: Payer: Self-pay | Admitting: Internal Medicine

## 2012-02-29 ENCOUNTER — Other Ambulatory Visit: Payer: Self-pay

## 2012-02-29 DIAGNOSIS — R55 Syncope and collapse: Secondary | ICD-10-CM

## 2012-02-29 DIAGNOSIS — R001 Bradycardia, unspecified: Secondary | ICD-10-CM

## 2012-02-29 DIAGNOSIS — I495 Sick sinus syndrome: Secondary | ICD-10-CM

## 2012-02-29 NOTE — Telephone Encounter (Signed)
Fu call Pt said he  does not have a pcp.

## 2012-02-29 NOTE — Telephone Encounter (Signed)
New msg Pt wants to talk to you about dmv paperwork that was supposed to be filled out. Please call him back

## 2012-02-29 NOTE — Telephone Encounter (Signed)
DMV papers faxed to Domingo Made[facility] @ [phone] And mailed to Pt's Home address  02/29/12/KM

## 2012-02-29 NOTE — Telephone Encounter (Signed)
lmom that all records were being sent.  Not sure that he needs to write letter  Just send records  That has been done and Tracy [name] is going to call him to come and pick up what we sent

## 2012-03-01 ENCOUNTER — Other Ambulatory Visit: Payer: Self-pay

## 2012-03-01 DIAGNOSIS — R55 Syncope and collapse: Secondary | ICD-10-CM

## 2012-03-01 DIAGNOSIS — R001 Bradycardia, unspecified: Secondary | ICD-10-CM

## 2012-03-01 DIAGNOSIS — I495 Sick sinus syndrome: Secondary | ICD-10-CM

## 2012-03-01 MED ORDER — PAROXETINE HCL 20 MG PO TABS: 20.0000 mg | ORAL_TABLET | ORAL | Status: DC

## 2012-03-01 NOTE — Telephone Encounter (Signed)
.   Requested Prescriptions   Signed Prescriptions Disp Refills  . PARoxetine (PAXIL) 20 MG tablet 30 tablet 1    Sig: Take 1 tablet (20 mg total) by mouth every morning.    Authorizing Provider: Hillis Range    Ordering User: Lacie Scotts

## 2012-03-15 ENCOUNTER — Telehealth: Payer: Self-pay

## 2012-03-15 NOTE — Telephone Encounter (Signed)
Return call to Tracy [name] to notify that [facility] review information requested  was mailed out today and he has any question to for [place] to contact office.

## 2012-03-17 ENCOUNTER — Other Ambulatory Visit: Payer: Self-pay

## 2012-03-17 DIAGNOSIS — R001 Bradycardia, unspecified: Secondary | ICD-10-CM

## 2012-03-17 DIAGNOSIS — I495 Sick sinus syndrome: Secondary | ICD-10-CM

## 2012-03-17 DIAGNOSIS — R55 Syncope and collapse: Secondary | ICD-10-CM

## 2012-03-17 MED ORDER — ATORVASTATIN CALCIUM 10 MG PO TABS: 10.0000 mg | ORAL_TABLET | Freq: Every day | ORAL | Status: DC

## 2012-04-06 ENCOUNTER — Encounter: Payer: 59 | Admitting: *Deleted

## 2012-04-12 ENCOUNTER — Encounter: Payer: Self-pay | Admitting: *Deleted

## 2012-04-26 ENCOUNTER — Other Ambulatory Visit: Payer: Self-pay | Admitting: Internal Medicine

## 2012-04-26 DIAGNOSIS — R001 Bradycardia, unspecified: Secondary | ICD-10-CM

## 2012-04-26 DIAGNOSIS — I495 Sick sinus syndrome: Secondary | ICD-10-CM

## 2012-04-26 DIAGNOSIS — R55 Syncope and collapse: Secondary | ICD-10-CM

## 2012-04-28 ENCOUNTER — Other Ambulatory Visit: Payer: Self-pay

## 2012-04-28 ENCOUNTER — Other Ambulatory Visit: Payer: Self-pay | Admitting: *Deleted

## 2012-04-28 DIAGNOSIS — I495 Sick sinus syndrome: Secondary | ICD-10-CM

## 2012-04-28 DIAGNOSIS — R55 Syncope and collapse: Secondary | ICD-10-CM

## 2012-04-28 DIAGNOSIS — R001 Bradycardia, unspecified: Secondary | ICD-10-CM

## 2012-04-28 MED ORDER — PAROXETINE HCL 20 MG PO TABS: 20.0000 mg | ORAL_TABLET | ORAL | Status: DC

## 2012-06-02 ENCOUNTER — Other Ambulatory Visit: Payer: Self-pay | Admitting: Internal Medicine

## 2012-06-02 ENCOUNTER — Telehealth: Payer: Self-pay | Admitting: Internal Medicine

## 2012-06-02 DIAGNOSIS — I495 Sick sinus syndrome: Secondary | ICD-10-CM

## 2012-06-02 DIAGNOSIS — R55 Syncope and collapse: Secondary | ICD-10-CM

## 2012-06-02 DIAGNOSIS — R001 Bradycardia, unspecified: Secondary | ICD-10-CM

## 2012-06-02 MED ORDER — ATORVASTATIN CALCIUM 10 MG PO TABS: 10.0000 mg | ORAL_TABLET | Freq: Every day | ORAL | Status: DC

## 2012-06-02 MED ORDER — METOPROLOL SUCCINATE ER 50 MG PO TB24: 50.0000 mg | ORAL_TABLET | Freq: Every day | ORAL | Status: DC

## 2012-06-02 MED ORDER — PAROXETINE HCL 20 MG PO TABS: 20.0000 mg | ORAL_TABLET | ORAL | Status: DC

## 2012-06-02 NOTE — Telephone Encounter (Signed)
New Problem:    Patient's wife called needing a refill of her husband's PARoxetine (PAXIL) 20 MG tablet Please call once the order has been placed. Patient would like this to be done today.

## 2012-06-02 NOTE — Telephone Encounter (Signed)
Refilled  paxil one time per Tracy [name]  Wife stated patient does not have PCP

## 2012-06-05 NOTE — Telephone Encounter (Signed)
lmom for wife that patient will need to get future refills on Paxil from a PCP.  If he does not have one we care refer him to one.  I also asked who wrote the original rx.

## 2012-06-06 ENCOUNTER — Telehealth (HOSPITAL_COMMUNITY): Payer: Self-pay

## 2012-06-06 NOTE — Telephone Encounter (Signed)
Called patient about his paxil refill to let him know that i could not refill it at this time and that th nurse was going to call him or his wife to discuss what Dr Johney Frame 's plan was for him on this medication.

## 2012-06-06 NOTE — Telephone Encounter (Signed)
Called patient to let him know that we do not refill paxil and that it comes from his pcp.

## 2012-07-01 ENCOUNTER — Other Ambulatory Visit: Payer: Self-pay | Admitting: Internal Medicine

## 2012-07-03 ENCOUNTER — Telehealth: Payer: Self-pay | Admitting: Internal Medicine

## 2012-07-03 ENCOUNTER — Other Ambulatory Visit: Payer: Self-pay | Admitting: *Deleted

## 2012-07-03 DIAGNOSIS — R55 Syncope and collapse: Secondary | ICD-10-CM

## 2012-07-03 DIAGNOSIS — R001 Bradycardia, unspecified: Secondary | ICD-10-CM

## 2012-07-03 DIAGNOSIS — I495 Sick sinus syndrome: Secondary | ICD-10-CM

## 2012-07-03 MED ORDER — PAROXETINE HCL 20 MG PO TABS: 20.0000 mg | ORAL_TABLET | Freq: Every morning | ORAL | Status: DC

## 2012-07-03 NOTE — Telephone Encounter (Signed)
Pt's wife calling re anti depressant, for his syncopal episode,  is dr Johney Frame going to refill,  wife needs to speak  To nurse, cell [phone]

## 2012-07-03 NOTE — Telephone Encounter (Signed)
Wife calling stating talked to kelly last week about RX for paxil--spoke with dr allred who OKed paxil to be ordered by us--called in to [facility] paxil 20mg  take 1 tab po  qd --#90 with 3 refills--wife made aware

## 2012-07-05 ENCOUNTER — Encounter: Payer: Self-pay | Admitting: *Deleted

## 2012-07-19 ENCOUNTER — Encounter: Payer: Self-pay | Admitting: Internal Medicine

## 2012-07-19 ENCOUNTER — Ambulatory Visit (INDEPENDENT_AMBULATORY_CARE_PROVIDER_SITE_OTHER): Payer: 59 | Admitting: *Deleted

## 2012-07-19 DIAGNOSIS — R001 Bradycardia, unspecified: Secondary | ICD-10-CM

## 2012-07-19 DIAGNOSIS — I498 Other specified cardiac arrhythmias: Secondary | ICD-10-CM

## 2012-07-25 ENCOUNTER — Encounter (INDEPENDENT_AMBULATORY_CARE_PROVIDER_SITE_OTHER): Payer: 59 | Admitting: Ophthalmology

## 2012-07-25 DIAGNOSIS — H2510 Age-related nuclear cataract, unspecified eye: Secondary | ICD-10-CM

## 2012-07-25 DIAGNOSIS — H35379 Puckering of macula, unspecified eye: Secondary | ICD-10-CM

## 2012-07-25 DIAGNOSIS — H43819 Vitreous degeneration, unspecified eye: Secondary | ICD-10-CM

## 2012-07-25 DIAGNOSIS — H33009 Unspecified retinal detachment with retinal break, unspecified eye: Secondary | ICD-10-CM

## 2012-07-26 ENCOUNTER — Encounter (INDEPENDENT_AMBULATORY_CARE_PROVIDER_SITE_OTHER): Payer: 59 | Admitting: Ophthalmology

## 2012-07-26 LAB — REMOTE PACEMAKER DEVICE
AL AMPLITUDE: 2.8 mv
AL IMPEDENCE PM: 563 Ohm
ATRIAL PACING PM: 2
BATTERY VOLTAGE: 2.79 V
BRDY-0002RV: 40 {beats}/min
RV LEAD AMPLITUDE: 16 mv
RV LEAD IMPEDENCE PM: 623 Ohm
RV LEAD THRESHOLD: 0.75 V
VENTRICULAR PACING PM: 2

## 2012-08-11 ENCOUNTER — Encounter: Payer: Self-pay | Admitting: *Deleted

## 2012-09-22 ENCOUNTER — Encounter: Payer: Self-pay | Admitting: *Deleted

## 2012-10-30 ENCOUNTER — Encounter: Payer: 59 | Admitting: *Deleted

## 2012-11-07 ENCOUNTER — Encounter: Payer: Self-pay | Admitting: *Deleted

## 2012-12-01 ENCOUNTER — Ambulatory Visit (INDEPENDENT_AMBULATORY_CARE_PROVIDER_SITE_OTHER): Payer: 59 | Admitting: *Deleted

## 2012-12-01 DIAGNOSIS — Z950 Presence of cardiac pacemaker: Secondary | ICD-10-CM

## 2012-12-01 DIAGNOSIS — I498 Other specified cardiac arrhythmias: Secondary | ICD-10-CM

## 2012-12-01 DIAGNOSIS — R001 Bradycardia, unspecified: Secondary | ICD-10-CM

## 2012-12-06 LAB — REMOTE PACEMAKER DEVICE
AL AMPLITUDE: 2.8 mv
AL IMPEDENCE PM: 546 Ohm
ATRIAL PACING PM: 3
BATTERY VOLTAGE: 2.79 V
BRDY-0002RV: 40 {beats}/min
RV LEAD AMPLITUDE: 16 mv
RV LEAD IMPEDENCE PM: 650 Ohm
RV LEAD THRESHOLD: 0.625 V
VENTRICULAR PACING PM: 3

## 2012-12-26 ENCOUNTER — Encounter: Payer: Self-pay | Admitting: *Deleted

## 2013-01-04 ENCOUNTER — Encounter: Payer: Self-pay | Admitting: Internal Medicine

## 2013-02-23 ENCOUNTER — Other Ambulatory Visit: Payer: Self-pay | Admitting: Emergency Medicine

## 2013-02-23 DIAGNOSIS — I495 Sick sinus syndrome: Secondary | ICD-10-CM

## 2013-02-23 DIAGNOSIS — R55 Syncope and collapse: Secondary | ICD-10-CM

## 2013-02-23 DIAGNOSIS — R001 Bradycardia, unspecified: Secondary | ICD-10-CM

## 2013-02-23 MED ORDER — METOPROLOL SUCCINATE ER 50 MG PO TB24: 50.0000 mg | ORAL_TABLET | Freq: Every day | ORAL | Status: DC

## 2013-03-13 ENCOUNTER — Other Ambulatory Visit: Payer: Self-pay | Admitting: Emergency Medicine

## 2013-03-13 DIAGNOSIS — I495 Sick sinus syndrome: Secondary | ICD-10-CM

## 2013-03-13 DIAGNOSIS — R001 Bradycardia, unspecified: Secondary | ICD-10-CM

## 2013-03-13 DIAGNOSIS — R55 Syncope and collapse: Secondary | ICD-10-CM

## 2013-03-13 MED ORDER — ATORVASTATIN CALCIUM 10 MG PO TABS: 10.0000 mg | ORAL_TABLET | Freq: Every day | ORAL | Status: DC

## 2013-04-16 ENCOUNTER — Encounter: Payer: 59 | Admitting: Internal Medicine

## 2013-04-24 ENCOUNTER — Other Ambulatory Visit: Payer: Self-pay | Admitting: Internal Medicine

## 2013-05-09 ENCOUNTER — Ambulatory Visit (INDEPENDENT_AMBULATORY_CARE_PROVIDER_SITE_OTHER): Payer: 59 | Admitting: Internal Medicine

## 2013-05-09 ENCOUNTER — Encounter: Payer: Self-pay | Admitting: Internal Medicine

## 2013-05-09 ENCOUNTER — Other Ambulatory Visit: Payer: Self-pay | Admitting: Internal Medicine

## 2013-05-09 VITALS — BP 118/80 | HR 74 | Ht 72.0 in | Wt 208.6 lb

## 2013-05-09 DIAGNOSIS — I498 Other specified cardiac arrhythmias: Secondary | ICD-10-CM

## 2013-05-09 DIAGNOSIS — G4730 Sleep apnea, unspecified: Secondary | ICD-10-CM

## 2013-05-09 DIAGNOSIS — R001 Bradycardia, unspecified: Secondary | ICD-10-CM

## 2013-05-09 DIAGNOSIS — G4733 Obstructive sleep apnea (adult) (pediatric): Secondary | ICD-10-CM

## 2013-05-09 NOTE — Patient Instructions (Addendum)
Remote monitoring is used to monitor your Pacemaker of ICD from home. This monitoring reduces the number of office visits required to check your device to one time per year. It allows [place] to keep an eye on the functioning of your device to ensure it is working properly. You are scheduled for a device check from home on August 13, 2013. You may send your transmission at any time that day. If you have a wireless device, the transmission will be sent automatically. After your physician reviews your transmission, you will receive a postcard with your next transmission date.  Your physician wants you to follow-up in: 1 year with Rick Duff, PA.  You will receive a reminder letter in the mail two months in advance. If you don't receive a letter, please call our office to schedule the follow-up appointment.   You have been referred to Dr Shelle Iron

## 2013-05-09 NOTE — Progress Notes (Signed)
   Tracy [name] is a 57 y.o. male who presents today for routine electrophysiology followup.  Since last being seen in our clinic, the patient reports doing very well.  He was found to have severe sleep apnea last year, but chose not to follow up with Dr Shelle Iron.    Today, he denies symptoms of palpitations, chest pain, shortness of breath,  lower extremity edema, dizziness, presyncope, or syncope.  The patient is otherwise without complaint today.   Past Medical History  Diagnosis Date  . Syncope 2008    dysautonomia  . Myopia   . Dyslipidemia   . Bradycardia 01/01/03    s/p PPM (MDT)  . Sleep apnea 9/12    severe   Past Surgical History  Procedure Laterality Date  . Pacemaker placement  01/01/03    generator change (MDT) by Dr Johney Frame 2012  . Cardiac catheterization  2008    no CAD    Current Outpatient Prescriptions  Medication Sig Dispense Refill  . atorvastatin (LIPITOR) 10 MG tablet Take 1 tablet (10 mg total) by mouth daily.  90 tablet  2  . loperamide (IMODIUM) 2 MG capsule Take 2 mg by mouth 4 (four) times daily as needed.        . metoprolol succinate (TOPROL-XL) 50 MG 24 hr tablet TAKE 1 TABLET (50 MG TOTAL) BY MOUTH DAILY.  60 tablet  1  . Multiple Vitamin (MULTIVITAMIN) capsule Take 1 capsule by mouth daily.        Marland Kitchen PARoxetine (PAXIL) 20 MG tablet Take 1 tablet (20 mg total) by mouth every morning.  90 tablet  3  . atenolol (TENORMIN) 25 MG tablet Take 1 tablet (25 mg total) by mouth daily.  30 tablet  11   No current facility-administered medications for this visit.    Physical Exam: Filed Vitals:   05/09/13 1007  BP: 118/80  Pulse: 74  Height: 6' (1.829 m)  Weight: 208 lb 9.6 oz (94.62 kg)    GEN- The patient is well appearing, alert and oriented x 3 today.   Head- normocephalic, atraumatic Eyes-  Sclera clear, conjunctiva pink Ears- hearing intact Oropharynx- clear Lungs- Clear to ausculation bilaterally, normal work of breathing Chest- pacemaker  pocket is well healed Heart- Regular rate and rhythm, no murmurs, rubs or gallops, PMI not laterally displaced GI- soft, NT, ND, + BS Extremities- no clubbing, cyanosis, or edema  Pacemaker interrogation- reviewed in detail today,  See PACEART report  Assessment and Plan:  1. Symptomatic bradycardia Normal pacemaker function See Pace Art report No changes today  2. Sleep apnea He snores and is fatigued during the day He is now more willing to see Dr Shelle Iron  Return to see the device PA in 1 year

## 2013-05-19 LAB — PACEMAKER DEVICE OBSERVATION
AL AMPLITUDE: 5.6 mv
AL IMPEDENCE PM: 528 Ohm
AL THRESHOLD: 0.5 V
ATRIAL PACING PM: 3.4
BATTERY VOLTAGE: 2.79 V
RV LEAD AMPLITUDE: 11.2 mv
RV LEAD IMPEDENCE PM: 652 Ohm
RV LEAD THRESHOLD: 0.625 V
VENTRICULAR PACING PM: 3.4

## 2013-05-22 ENCOUNTER — Telehealth: Payer: Self-pay | Admitting: Internal Medicine

## 2013-05-22 NOTE — Telephone Encounter (Signed)
Dept Of Transportation papers signed By Dr.Allred,Scanned in Pt Aware ready for Pick up, Left at [employer] For Pt  05/22/13/KM

## 2013-05-25 ENCOUNTER — Encounter: Payer: Self-pay | Admitting: Internal Medicine

## 2013-06-21 ENCOUNTER — Ambulatory Visit (INDEPENDENT_AMBULATORY_CARE_PROVIDER_SITE_OTHER): Payer: 59 | Admitting: Pulmonary Disease

## 2013-06-21 ENCOUNTER — Encounter: Payer: Self-pay | Admitting: Pulmonary Disease

## 2013-06-21 VITALS — BP 120/90 | HR 90 | Temp 98.6°F | Ht 71.0 in | Wt 208.4 lb

## 2013-06-21 DIAGNOSIS — G4733 Obstructive sleep apnea (adult) (pediatric): Secondary | ICD-10-CM

## 2013-06-21 NOTE — Progress Notes (Signed)
Subjective:    Patient ID: Tracy [name], male    DOB: Jan 22, 1956, 57 y.o.   MRN: [id number]  HPI The patient is a 57 year old male who been asked to see for management of obstructive and central sleep apnea.  He underwent a sleep study in 2012, and was found to have severe obstructive and central apnea with an AHI of 88 events per hour.  He was started on CPAP, and found to have an optimal pressure of 16 cm of water pressure.  The patient was never started on CPAP at that time, and now comes in today for further evaluation.  He has been told that he has loud snoring at night, but no one has mentioned an abnormal breathing pattern during sleep.  He does have frequent awakenings at night and he plans on going to the bathroom, and isn't rested in the mornings at least 50% of the time.  He has some slight pressure during the day with inactivity, and also in the evening while trying to watch television or movies.  He denies any sleepiness with driving.  He does think that he has a little more sleepiness than he should.  The patient states that his weight has been neutral over the last 2 years, and his Epworth score today is 7  Sleep Questionnaire What time do you typically go to bed?( Between what hours) 9pm 9pm at 1125 on 06/21/13 by Maisie Fus, CMA How long does it take you to fall asleep? 1-2 minutes 1-2 minutes at 1125 on 06/21/13 by Maisie Fus, CMA How many times during the night do you wake up? 3 3 at 1125 on 06/21/13 by Maisie Fus, CMA What time do you get out of bed to start your day? 1000 1000 at 1125 on 06/21/13 by Maisie Fus, CMA Do you drive or operate heavy machinery in your occupation? No No at 1125 on 06/21/13 by Maisie Fus, CMA How much has your weight changed (up or down) over the past two years? (In pounds) 5 lb (2.268 kg)5 lb (2.268 kg) increase at 1125 on 06/21/13 by Maisie Fus, CMA Have you ever had a sleep study before? No No at 1125 on 06/21/13 by  Maisie Fus, CMA Do you currently use CPAP? No No at 1125 on 06/21/13 by Maisie Fus, CMA Do you wear oxygen at any time? No No at 1125 on 06/21/13 by Maisie Fus, CMA   Review of Systems  Constitutional: Negative for fever and unexpected weight change.  HENT: Positive for congestion. Negative for ear pain, nosebleeds, sore throat, rhinorrhea, sneezing, trouble swallowing, dental problem, postnasal drip and sinus pressure.   Eyes: Negative for redness and itching.  Respiratory: Negative for cough, chest tightness, shortness of breath and wheezing.   Cardiovascular: Positive for palpitations ( pt has pacemaker). Negative for leg swelling.  Gastrointestinal: Negative for nausea and vomiting.  Genitourinary: Negative for dysuria.  Musculoskeletal: Negative for joint swelling.  Skin: Negative for rash.  Neurological: Negative for headaches.  Hematological: Does not bruise/bleed easily.  Psychiatric/Behavioral: Negative for dysphoric mood. The patient is not nervous/anxious.        Objective:   Physical Exam Constitutional:  Overweight male, no acute distress  HENT:  Nares patent without discharge  Oropharynx without exudate, palate and uvula are mildly elongated.   Eyes:  Perrla, eomi, no scleral icterus  Neck:  No JVD, no TMG  Cardiovascular:  Normal rate, regular rhythm, no rubs or gallops.  1/6sem        Intact distal pulses  Pulmonary :  Normal breath sounds, no stridor or respiratory distress   No rales, rhonchi, or wheezing  Abdominal:  Soft, nondistended, bowel sounds present.  No tenderness noted.   Musculoskeletal:  1+ lower extremity edema noted.  Lymph Nodes:  No cervical lymphadenopathy noted  Skin:  No cyanosis noted  Neurologic:  Alert, appropriate, moves all 4 extremities without obvious deficit.         Assessment & Plan:

## 2013-06-21 NOTE — Patient Instructions (Addendum)
Will start on cpap at a moderate level.  Please call if having issues with tolerance. Work on modest weight loss followup with me in 6 weeks.

## 2013-06-21 NOTE — Assessment & Plan Note (Signed)
The patient has a history of severe obstructive and central sleep apnea, and is somewhat symptomatic during the day by his history.  He also has underlying cardiac disease that would benefit from aggressive treatment of this.  I have had a long discussion with him about the pathophysiology of sleep apnea, including its impacted his quality of life and cardiovascular health.  I have recommended a trial of CPAP, the patient is willing to do this.  I will set the patient up on cpap at a moderate pressure level to allow for desensitization, and will troubleshoot the device over the next 4-6weeks if needed.  The pt is to call me if having issues with tolerance.  Will then optimize the pressure once patient is able to wear cpap on a consistent basis.

## 2013-06-22 ENCOUNTER — Other Ambulatory Visit: Payer: Self-pay | Admitting: Internal Medicine

## 2013-07-05 ENCOUNTER — Telehealth: Payer: Self-pay | Admitting: Internal Medicine

## 2013-07-05 NOTE — Telephone Encounter (Signed)
Walk In Patient Form received: Papers to be filled out for the  [facility]  Given to [facility] L for Dr. Johney Frame in Pod A for completion

## 2013-07-19 ENCOUNTER — Telehealth: Payer: Self-pay | Admitting: Internal Medicine

## 2013-07-19 NOTE — Telephone Encounter (Signed)
Checking to see if the papers for [facility] has been sent to them.

## 2013-07-19 NOTE — Telephone Encounter (Signed)
Routed to Dennis Bast, Charity fundraiser.

## 2013-07-24 NOTE — Telephone Encounter (Signed)
Left patient a message that the forms are ready

## 2013-07-25 ENCOUNTER — Ambulatory Visit (INDEPENDENT_AMBULATORY_CARE_PROVIDER_SITE_OTHER): Payer: 59 | Admitting: Ophthalmology

## 2013-07-25 DIAGNOSIS — H2510 Age-related nuclear cataract, unspecified eye: Secondary | ICD-10-CM

## 2013-07-25 DIAGNOSIS — H35379 Puckering of macula, unspecified eye: Secondary | ICD-10-CM

## 2013-07-25 DIAGNOSIS — H43819 Vitreous degeneration, unspecified eye: Secondary | ICD-10-CM

## 2013-07-25 DIAGNOSIS — H33009 Unspecified retinal detachment with retinal break, unspecified eye: Secondary | ICD-10-CM

## 2013-08-02 ENCOUNTER — Encounter: Payer: Self-pay | Admitting: Pulmonary Disease

## 2013-08-02 ENCOUNTER — Ambulatory Visit (INDEPENDENT_AMBULATORY_CARE_PROVIDER_SITE_OTHER): Payer: 59 | Admitting: Pulmonary Disease

## 2013-08-02 VITALS — BP 124/78 | HR 65 | Temp 97.9°F | Ht 72.0 in | Wt 206.8 lb

## 2013-08-02 DIAGNOSIS — G4733 Obstructive sleep apnea (adult) (pediatric): Secondary | ICD-10-CM

## 2013-08-02 NOTE — Assessment & Plan Note (Signed)
The patient is doing very well with CPAP, although he is having some mild excoriation on the bridge of his nose.  He is having no issues with pressure, and his wife feels that he is sleeping better and is no longer snoring.  The patient has not seen a change in his sleep or daytime alertness, but I have reminded him that we have yet to optimize his pressure.  We'll take the opportunity to use the automatic setting to optimize his pressure, and we'll call him with the results.  I have encouraged him to try a different CPAP mask he continues to have skin issues on the bridge of his nose.  I have also encouraged him to work aggressively on weight loss.

## 2013-08-02 NOTE — Patient Instructions (Addendum)
Continue on cpap, and we will optimize your pressure on the auto setting for the next few weeks.  Will call you with the results when download available. Work on weight loss If your mask continues to give you issues on the bridge of the nose, consider trying a different type or style. followup with me in 6mos if doing well.

## 2013-08-02 NOTE — Progress Notes (Signed)
  Subjective:    Patient ID: Tracy [name], male    DOB: 1956/01/23, 57 y.o.   MRN: [id number]  HPI The patient comes in today for followup of his obstructive sleep apnea.  He has been wearing CPAP compliantly, and is having no issues with pressure.  He is having some mild excoriation on the bridge of his nose from the CPAP mask, but feels that it is tolerable.  His wife has not heard breakthrough snoring, and feels that he is sleeping much more peacefully.  He has not seen a big change in his sleep or daytime alertness.   Review of Systems  Constitutional: Negative for fever and unexpected weight change.  HENT: Negative for ear pain, nosebleeds, congestion, sore throat, rhinorrhea, sneezing, trouble swallowing, dental problem, postnasal drip and sinus pressure.   Eyes: Negative for redness and itching.  Respiratory: Negative for cough, chest tightness, shortness of breath and wheezing.   Cardiovascular: Negative for palpitations and leg swelling.  Gastrointestinal: Negative for nausea and vomiting.  Genitourinary: Negative for dysuria.  Musculoskeletal: Negative for joint swelling.  Skin: Negative for rash.  Neurological: Negative for headaches.  Hematological: Does not bruise/bleed easily.  Psychiatric/Behavioral: Negative for dysphoric mood. The patient is not nervous/anxious.        Objective:   Physical Exam Well-developed male in no acute distress Nose without purulence or discharge noted No skin breakdown or pressure necrosis from the CPAP mask.  There is mild excoriation on the bridge of his nose. Neck without lymphadenopathy or thyromegaly Lower extremities without edema, no cyanosis Alert and oriented, does not appear to be sleepy, moves all 4 extremities.       Assessment & Plan:

## 2013-08-13 ENCOUNTER — Telehealth: Payer: Self-pay | Admitting: Internal Medicine

## 2013-08-13 ENCOUNTER — Ambulatory Visit (INDEPENDENT_AMBULATORY_CARE_PROVIDER_SITE_OTHER): Payer: 59 | Admitting: *Deleted

## 2013-08-13 DIAGNOSIS — I498 Other specified cardiac arrhythmias: Secondary | ICD-10-CM

## 2013-08-13 DIAGNOSIS — R001 Bradycardia, unspecified: Secondary | ICD-10-CM

## 2013-08-13 NOTE — Telephone Encounter (Signed)
Spoke with patient and reviewed procedure for remote download.  He will try again later this evening.

## 2013-08-13 NOTE — Telephone Encounter (Signed)
New Problem:  Pt states his medtronic remote device gets to the third light in the 5 light series and the device turns off. Pt states his device has never done that.  Pt states he has brand new batteries and he does not know why the device keeps shutting off. Pt would like to be advised.

## 2013-08-17 LAB — REMOTE PACEMAKER DEVICE
AL AMPLITUDE: 2.8 mv
AL IMPEDENCE PM: 528 Ohm
ATRIAL PACING PM: 4
BATTERY VOLTAGE: 2.79 V
BRDY-0002RV: 40 {beats}/min
RV LEAD AMPLITUDE: 22.4 mv
RV LEAD IMPEDENCE PM: 666 Ohm
RV LEAD THRESHOLD: 0.625 V
VENTRICULAR PACING PM: 4

## 2013-09-03 ENCOUNTER — Encounter: Payer: Self-pay | Admitting: Internal Medicine

## 2013-09-06 ENCOUNTER — Encounter: Payer: Self-pay | Admitting: Internal Medicine

## 2013-10-22 ENCOUNTER — Other Ambulatory Visit: Payer: Self-pay | Admitting: Internal Medicine

## 2013-11-11 ENCOUNTER — Other Ambulatory Visit: Payer: Self-pay | Admitting: Pulmonary Disease

## 2013-11-11 DIAGNOSIS — G4733 Obstructive sleep apnea (adult) (pediatric): Secondary | ICD-10-CM

## 2013-11-19 ENCOUNTER — Ambulatory Visit (INDEPENDENT_AMBULATORY_CARE_PROVIDER_SITE_OTHER): Payer: 59 | Admitting: *Deleted

## 2013-11-19 DIAGNOSIS — I498 Other specified cardiac arrhythmias: Secondary | ICD-10-CM

## 2013-11-19 DIAGNOSIS — R001 Bradycardia, unspecified: Secondary | ICD-10-CM

## 2013-11-20 LAB — MDC_IDC_ENUM_SESS_TYPE_REMOTE
Battery Impedance: 134 Ohm
Battery Remaining Longevity: 154 mo
Battery Voltage: 2.79 V
Brady Statistic AP VP Percent: 4 %
Brady Statistic AP VS Percent: 0 %
Brady Statistic AS VP Percent: 0 %
Brady Statistic AS VS Percent: 96 %
Date Time Interrogation Session: 20150113041404
Lead Channel Impedance Value: 505 Ohm
Lead Channel Impedance Value: 620 Ohm
Lead Channel Pacing Threshold Amplitude: 0.75 V
Lead Channel Pacing Threshold Pulse Width: 0.4 ms
Lead Channel Sensing Intrinsic Amplitude: 2.8 mV
Lead Channel Sensing Intrinsic Amplitude: 8 mV
Lead Channel Setting Pacing Amplitude: 2 V
Lead Channel Setting Pacing Amplitude: 2.5 V
Lead Channel Setting Pacing Pulse Width: 0.4 ms
Lead Channel Setting Sensing Sensitivity: 2.8 mV

## 2013-12-03 ENCOUNTER — Encounter: Payer: Self-pay | Admitting: *Deleted

## 2013-12-05 ENCOUNTER — Encounter: Payer: Self-pay | Admitting: Internal Medicine

## 2013-12-05 ENCOUNTER — Other Ambulatory Visit: Payer: Self-pay | Admitting: Internal Medicine

## 2014-01-22 ENCOUNTER — Other Ambulatory Visit: Payer: Self-pay | Admitting: Internal Medicine

## 2014-01-30 ENCOUNTER — Encounter (INDEPENDENT_AMBULATORY_CARE_PROVIDER_SITE_OTHER): Payer: Self-pay

## 2014-01-30 ENCOUNTER — Ambulatory Visit (INDEPENDENT_AMBULATORY_CARE_PROVIDER_SITE_OTHER): Payer: 59 | Admitting: Pulmonary Disease

## 2014-01-30 ENCOUNTER — Encounter: Payer: Self-pay | Admitting: Pulmonary Disease

## 2014-01-30 VITALS — BP 122/82 | HR 75 | Temp 98.4°F | Ht 72.0 in | Wt 213.0 lb

## 2014-01-30 DIAGNOSIS — G4733 Obstructive sleep apnea (adult) (pediatric): Secondary | ICD-10-CM

## 2014-01-30 NOTE — Patient Instructions (Signed)
Will send you to the sleep center for a mask fitting. Will send order to your home care company for new supplies. Work on weight loss followup with me again in one year if doing well, but call if having issues with cpap.

## 2014-01-30 NOTE — Progress Notes (Signed)
   Subjective:    Patient ID: Tracy [name], male    DOB: June 30, 1956, 58 y.o.   MRN: [id number]  HPI The patient comes in today for followup of his known obstructive sleep apnea. He has been having difficulties with CPAP usage recently because of mask fit, and has been having soreness and irritation to the bridge of his nose. Prior to this, he was sleeping much better, and his wife noted no breakthrough events. He has been to his home care company for mask changes, without success. Of note, his weight is up 7 pounds since last visit.   Review of Systems  Constitutional: Negative for fever and unexpected weight change.  HENT: Negative for congestion, dental problem, ear pain, nosebleeds, postnasal drip, rhinorrhea, sinus pressure, sneezing, sore throat and trouble swallowing.   Eyes: Negative for redness and itching.  Respiratory: Negative for cough, chest tightness, shortness of breath and wheezing.   Cardiovascular: Negative for palpitations and leg swelling.  Gastrointestinal: Negative for nausea and vomiting.  Genitourinary: Negative for dysuria.  Musculoskeletal: Negative for joint swelling.  Skin: Negative for rash.  Neurological: Negative for headaches.  Hematological: Does not bruise/bleed easily.  Psychiatric/Behavioral: Negative for dysphoric mood. The patient is not nervous/anxious.        Objective:   Physical Exam Wd male in nad Nose without purulence or d/c noted. Mild excoriation on bridge of nose, no skin breakdown. Neck without LN or TMG LE without edema, no cyanosis. Alert, does not appear sleepy, moves all 4.        Assessment & Plan:

## 2014-01-30 NOTE — Assessment & Plan Note (Signed)
The patient has done well with CPAP overall, with improvement in his sleep and daytime alertness. However, he is currently struggling with mask fit, and I will therefore send him to the sleep Center for a formal fitting. I've also encouraged him to work aggressively on weight loss. If he is doing well, will see him back in one year.

## 2014-02-13 ENCOUNTER — Other Ambulatory Visit (HOSPITAL_BASED_OUTPATIENT_CLINIC_OR_DEPARTMENT_OTHER): Payer: 59

## 2014-02-21 ENCOUNTER — Encounter: Payer: 59 | Admitting: *Deleted

## 2014-02-25 ENCOUNTER — Other Ambulatory Visit: Payer: Self-pay | Admitting: Internal Medicine

## 2014-02-25 ENCOUNTER — Ambulatory Visit (INDEPENDENT_AMBULATORY_CARE_PROVIDER_SITE_OTHER): Payer: 59 | Admitting: *Deleted

## 2014-02-25 DIAGNOSIS — R001 Bradycardia, unspecified: Secondary | ICD-10-CM

## 2014-02-25 DIAGNOSIS — I498 Other specified cardiac arrhythmias: Secondary | ICD-10-CM

## 2014-03-01 LAB — MDC_IDC_ENUM_SESS_TYPE_REMOTE
Battery Remaining Longevity: 156 mo
Battery Voltage: 2.79 V
Brady Statistic AP VP Percent: 4.2 %
Brady Statistic AP VS Percent: 0.2 %
Brady Statistic AS VP Percent: 0.1 %
Brady Statistic AS VS Percent: 95.6 %
Lead Channel Impedance Value: 572 Ohm
Lead Channel Impedance Value: 670 Ohm
Lead Channel Pacing Threshold Amplitude: 0.75 V
Lead Channel Pacing Threshold Pulse Width: 0.4 ms
Lead Channel Sensing Intrinsic Amplitude: 2.8 mV
Lead Channel Sensing Intrinsic Amplitude: 5.6 mV
Lead Channel Setting Pacing Amplitude: 2 V
Lead Channel Setting Pacing Amplitude: 2.5 V
Lead Channel Setting Pacing Pulse Width: 0.4 ms
Lead Channel Setting Sensing Sensitivity: 2.8 mV

## 2014-03-05 ENCOUNTER — Encounter: Payer: Self-pay | Admitting: *Deleted

## 2014-03-06 ENCOUNTER — Encounter: Payer: Self-pay | Admitting: *Deleted

## 2014-03-07 ENCOUNTER — Encounter: Payer: Self-pay | Admitting: Internal Medicine

## 2014-03-11 NOTE — Progress Notes (Signed)
PPM remote 

## 2014-06-07 ENCOUNTER — Other Ambulatory Visit: Payer: Self-pay | Admitting: Internal Medicine

## 2014-06-16 ENCOUNTER — Other Ambulatory Visit: Payer: Self-pay | Admitting: Internal Medicine

## 2014-06-17 NOTE — Telephone Encounter (Signed)
Primary care should fill noncardiac prescriptions

## 2014-06-18 ENCOUNTER — Encounter: Payer: Self-pay | Admitting: *Deleted

## 2014-06-19 ENCOUNTER — Other Ambulatory Visit: Payer: Self-pay | Admitting: Internal Medicine

## 2014-06-19 ENCOUNTER — Other Ambulatory Visit: Payer: Self-pay

## 2014-06-19 MED ORDER — PAROXETINE HCL 20 MG PO TABS: ORAL_TABLET | ORAL | Status: DC

## 2014-07-24 ENCOUNTER — Ambulatory Visit: Payer: 59 | Admitting: Internal Medicine

## 2014-07-25 ENCOUNTER — Ambulatory Visit (INDEPENDENT_AMBULATORY_CARE_PROVIDER_SITE_OTHER): Payer: 59 | Admitting: Ophthalmology

## 2014-08-02 ENCOUNTER — Ambulatory Visit (INDEPENDENT_AMBULATORY_CARE_PROVIDER_SITE_OTHER): Payer: 59 | Admitting: Ophthalmology

## 2014-08-02 DIAGNOSIS — H35379 Puckering of macula, unspecified eye: Secondary | ICD-10-CM

## 2014-08-02 DIAGNOSIS — H2510 Age-related nuclear cataract, unspecified eye: Secondary | ICD-10-CM

## 2014-08-02 DIAGNOSIS — H33009 Unspecified retinal detachment with retinal break, unspecified eye: Secondary | ICD-10-CM

## 2014-08-02 DIAGNOSIS — H43819 Vitreous degeneration, unspecified eye: Secondary | ICD-10-CM

## 2014-08-12 ENCOUNTER — Ambulatory Visit (INDEPENDENT_AMBULATORY_CARE_PROVIDER_SITE_OTHER): Payer: 59 | Admitting: Internal Medicine

## 2014-08-12 ENCOUNTER — Encounter: Payer: Self-pay | Admitting: Internal Medicine

## 2014-08-12 VITALS — BP 118/72 | HR 65 | Ht 72.0 in | Wt 210.4 lb

## 2014-08-12 DIAGNOSIS — E785 Hyperlipidemia, unspecified: Secondary | ICD-10-CM

## 2014-08-12 DIAGNOSIS — G4733 Obstructive sleep apnea (adult) (pediatric): Secondary | ICD-10-CM

## 2014-08-12 DIAGNOSIS — R55 Syncope and collapse: Secondary | ICD-10-CM

## 2014-08-12 DIAGNOSIS — R001 Bradycardia, unspecified: Secondary | ICD-10-CM

## 2014-08-12 LAB — MDC_IDC_ENUM_SESS_TYPE_INCLINIC
Battery Impedance: 158 Ohm
Battery Remaining Longevity: 149 mo
Battery Voltage: 2.79 V
Brady Statistic AP VP Percent: 3 %
Brady Statistic AP VS Percent: 0 %
Brady Statistic AS VP Percent: 0 %
Brady Statistic AS VS Percent: 97 %
Date Time Interrogation Session: 20151005144853
Lead Channel Impedance Value: 536 Ohm
Lead Channel Impedance Value: 634 Ohm
Lead Channel Pacing Threshold Amplitude: 0.5 V
Lead Channel Pacing Threshold Amplitude: 0.75 V
Lead Channel Pacing Threshold Pulse Width: 0.4 ms
Lead Channel Pacing Threshold Pulse Width: 0.4 ms
Lead Channel Sensing Intrinsic Amplitude: 4 mV
Lead Channel Sensing Intrinsic Amplitude: 8 mV
Lead Channel Setting Pacing Amplitude: 2 V
Lead Channel Setting Pacing Amplitude: 2.5 V
Lead Channel Setting Pacing Pulse Width: 0.4 ms
Lead Channel Setting Sensing Sensitivity: 2.8 mV

## 2014-08-12 NOTE — Patient Instructions (Addendum)
Your physician wants you to follow-up in: 12 months with Dr Vallery Ridge will receive a reminder letter in the mail two months in advance. If you don't receive a letter, please call our office to schedule the follow-up appointment.   Remote monitoring is used to monitor your Pacemaker or ICD from home. This monitoring reduces the number of office visits required to check your device to one time per year. It allows [place] to keep an eye on the functioning of your device to ensure it is working properly. You are scheduled for a device check from home on 11/22/14. You may send your transmission at any time that day. If you have a wireless device, the transmission will be sent automatically. After your physician reviews your transmission, you will receive a postcard with your next transmission date.   Your physician recommends that you return for lab work fasting lipid/liver

## 2014-08-12 NOTE — Progress Notes (Signed)
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  Since last being seen in our clinic, the patient reports doing very well.  Today, he denies symptoms of palpitations, chest pain, shortness of breath,  lower extremity edema, dizziness, presyncope, or syncope.  The patient is otherwise without complaint today.   Past Medical History  Diagnosis Date  . Syncope 2008    dysautonomia  . Myopia   . Dyslipidemia   . Bradycardia 01/01/03    s/p PPM (MDT)  . Sleep apnea 9/12    severe  . Abnormal heart rhythm     PACEMAKER   Past Surgical History  Procedure Laterality Date  . Pacemaker placement  01/01/03    generator change (MDT) by Dr Rayann Heman 2012  . Cardiac catheterization  2008    no CAD    ROS- all systems are reviewed and negative except as per HPI above  Current Outpatient Prescriptions  Medication Sig Dispense Refill  . atorvastatin (LIPITOR) 10 MG tablet TAKE 1 TABLET (10 MG TOTAL) BY MOUTH DAILY.... MAX OF 30 DAYS ON INSURANCE  30 tablet  2  . loperamide (IMODIUM) 2 MG capsule Take 2 mg by mouth 4 (four) times daily as needed.        . metoprolol succinate (TOPROL-XL) 50 MG 24 hr tablet TAKE 1 TABLET (50 MG TOTAL) BY MOUTH DAILY.  60 tablet  6  . Multiple Vitamin (MULTIVITAMIN) capsule Take 1 capsule by mouth daily.        Marland Kitchen PARoxetine (PAXIL) 20 MG tablet TAKE 1 TABLET BY MOUTH EVERY DAY  90 tablet  0  . atenolol (TENORMIN) 25 MG tablet Take 25 mg by mouth daily.       No current facility-administered medications for this visit.    Physical Exam: Filed Vitals:   08/12/14 1409  BP: 118/72  Pulse: 65  Height: 6' (1.829 m)  Weight: 210 lb 6.4 oz (95.437 kg)    GEN- The patient is well appearing, alert and oriented x 3 today.   Head- normocephalic, atraumatic Eyes-  Sclera clear, conjunctiva pink Ears- hearing intact Oropharynx- clear Lungs- Clear to ausculation bilaterally, normal work of breathing Chest-  pacemaker pocket is well healed Heart- Regular rate and rhythm, no murmurs, rubs or gallops, PMI not laterally displaced GI- soft, NT, ND, + BS Extremities- no clubbing, cyanosis, or edema  Pacemaker interrogation- reviewed in detail today,  See PACEART report  Assessment and Plan:  1. Symptomatic sinus bradycardia/ dysautonomia Normal pacemaker function See Pace Art report No changes today  2. Sleep apnea Follow-up with Dr Gwenette Greet  3. HL Will check LFTs/Lipids (fasting)  Return to see the device NP in 1 year carelink

## 2014-08-16 ENCOUNTER — Other Ambulatory Visit (INDEPENDENT_AMBULATORY_CARE_PROVIDER_SITE_OTHER): Payer: 59 | Admitting: *Deleted

## 2014-08-16 DIAGNOSIS — E785 Hyperlipidemia, unspecified: Secondary | ICD-10-CM

## 2014-08-17 LAB — LIPID PANEL
Cholesterol: 186 mg/dL (ref 0–200)
HDL: 74 mg/dL (ref >39)
LDL Cholesterol: 97 mg/dL (ref 0–99)
Total CHOL/HDL Ratio: 2.5 Ratio
Triglycerides: 73 mg/dL (ref <150)
VLDL: 15 mg/dL (ref 0–40)

## 2014-08-17 LAB — HEPATIC FUNCTION PANEL
ALT: 32 U/L (ref 0–53)
AST: 25 U/L (ref 0–37)
Albumin: 4.2 g/dL (ref 3.5–5.2)
Alkaline Phosphatase: 65 U/L (ref 39–117)
Bilirubin, Direct: 0.1 mg/dL (ref 0.0–0.3)
Indirect Bilirubin: 0.6 mg/dL (ref 0.2–1.2)
Total Bilirubin: 0.7 mg/dL (ref 0.2–1.2)
Total Protein: 6.6 g/dL (ref 6.0–8.3)

## 2014-08-21 ENCOUNTER — Encounter: Payer: Self-pay | Admitting: Internal Medicine

## 2014-08-23 NOTE — Telephone Encounter (Signed)
Nothing was typed/tmj 

## 2014-09-05 ENCOUNTER — Other Ambulatory Visit: Payer: Self-pay | Admitting: Internal Medicine

## 2014-09-16 ENCOUNTER — Other Ambulatory Visit: Payer: Self-pay | Admitting: Internal Medicine

## 2014-09-16 NOTE — Telephone Encounter (Signed)
Need to get from PCP

## 2014-09-23 ENCOUNTER — Other Ambulatory Visit: Payer: Self-pay | Admitting: Internal Medicine

## 2014-09-25 ENCOUNTER — Telehealth: Payer: Self-pay

## 2014-09-25 NOTE — Telephone Encounter (Signed)
Ok to give only a 30 day supply while he obtains a PCP

## 2014-09-25 NOTE — Telephone Encounter (Addendum)
He has a pacemaker for sick sinus syndrome.  He is not on paxil for this indication.  It may be that Dr Verlon Setting started this years ago for dysautonomia.  We could try to wean it off if he would like.

## 2014-09-26 ENCOUNTER — Other Ambulatory Visit: Payer: Self-pay

## 2014-09-26 MED ORDER — PAROXETINE HCL 20 MG PO TABS: ORAL_TABLET | ORAL | Status: DC

## 2014-11-12 ENCOUNTER — Encounter: Payer: 59 | Admitting: *Deleted

## 2014-11-12 ENCOUNTER — Telehealth: Payer: Self-pay | Admitting: Internal Medicine

## 2014-11-12 NOTE — Telephone Encounter (Signed)
New message      Pt cannot get his remote transmission to go thru

## 2014-11-12 NOTE — Telephone Encounter (Signed)
Spoke w/ pt after determining that home monitor was hooked up correctly I informed him that I would order him a wirex. Informed him that if he starts to feel bad / fatigued / or lack of energy to call office to come in to have PPM checked b/c it will be 6-8 weeks before he receives wirex. Pt verbalized understanding.

## 2014-11-18 ENCOUNTER — Other Ambulatory Visit: Payer: Self-pay | Admitting: Internal Medicine

## 2014-12-16 ENCOUNTER — Telehealth: Payer: Self-pay | Admitting: Internal Medicine

## 2014-12-16 NOTE — Telephone Encounter (Signed)
Pt aware dept of transportation papers ready for pick up.

## 2014-12-16 NOTE — Telephone Encounter (Signed)
Walk in pt form " Dept Of Transportation papers" dropped off gave to [employer]

## 2014-12-17 ENCOUNTER — Other Ambulatory Visit: Payer: Self-pay | Admitting: Internal Medicine

## 2014-12-19 ENCOUNTER — Telehealth: Payer: Self-pay | Admitting: Internal Medicine

## 2014-12-19 NOTE — Telephone Encounter (Signed)
Dr Rayann Heman completed his portion of the form and he will need to have the other completed by his PCP.  I have called and left a message for the patient on his voicemail with these recommendations.

## 2014-12-19 NOTE — Telephone Encounter (Signed)
New message      Calling to see if Dr Rayann Heman has completed a DVM form----especially page 3.  Please call when ready

## 2014-12-19 NOTE — Telephone Encounter (Signed)
Left Pt VM to return call about Dept Of Transportation.

## 2014-12-25 ENCOUNTER — Telehealth: Payer: Self-pay | Admitting: Internal Medicine

## 2014-12-25 NOTE — Telephone Encounter (Signed)
Pt picked up Dept of Transportation papers.

## 2015-01-03 ENCOUNTER — Encounter: Payer: Self-pay | Admitting: *Deleted

## 2015-01-31 ENCOUNTER — Ambulatory Visit: Payer: 59 | Admitting: Pulmonary Disease

## 2015-02-03 ENCOUNTER — Ambulatory Visit (INDEPENDENT_AMBULATORY_CARE_PROVIDER_SITE_OTHER): Payer: 59 | Admitting: Pulmonary Disease

## 2015-02-03 ENCOUNTER — Encounter: Payer: Self-pay | Admitting: Pulmonary Disease

## 2015-02-03 VITALS — BP 118/64 | HR 83 | Temp 97.0°F | Ht 71.0 in | Wt 213.2 lb

## 2015-02-03 DIAGNOSIS — G4733 Obstructive sleep apnea (adult) (pediatric): Secondary | ICD-10-CM

## 2015-02-03 NOTE — Progress Notes (Signed)
   Subjective:    Patient ID: Tracy [name], male    DOB: Nov 07, 1956, 59 y.o.   MRN: [id number]  HPI The patient comes in today for follow-up of his obstructive sleep apnea. He is wearing C Pap compliantly, and has done well with the device, but he is now having mask issues again. His mask keeps breaking, and he is now having to pull very tight in order to prevent leaks. He clearly has seen a great benefit and wearing the device, and is satisfied with his sleep and daytime alertness.   Review of Systems  Constitutional: Negative for fever and unexpected weight change.  HENT: Negative for congestion, dental problem, ear pain, nosebleeds, postnasal drip, rhinorrhea, sinus pressure, sneezing, sore throat and trouble swallowing.   Eyes: Negative for redness and itching.  Respiratory: Negative for cough, chest tightness, shortness of breath and wheezing.   Cardiovascular: Negative for palpitations and leg swelling.  Gastrointestinal: Negative for nausea and vomiting.  Genitourinary: Negative for dysuria.  Musculoskeletal: Negative for joint swelling.  Skin: Negative for rash.  Neurological: Negative for headaches.  Hematological: Does not bruise/bleed easily.  Psychiatric/Behavioral: Negative for dysphoric mood. The patient is not nervous/anxious.        Objective:   Physical Exam Overweight male in no acute distress Nose without purulence or discharge noted Neck without lymphadenopathy or thyromegaly No skin breakdown or pressure necrosis from the C Pap mask Lower extremities without edema, no cyanosis Alert, does not appear to be sleepy, moves all 4 extremities.       Assessment & Plan:

## 2015-02-03 NOTE — Assessment & Plan Note (Signed)
The pt is doing well with cpap, but still has mask fitting issues.  Will refer him back to the sleep center for a fitting, and have also encouraged him to work on weight loss.  Will see him back in one year.

## 2015-02-03 NOTE — Patient Instructions (Signed)
Stay on cpap, and keep up with supplies Will refer you to the sleep center for a mask fitting.  Think about amara view followup with me again in one year.

## 2015-02-05 ENCOUNTER — Ambulatory Visit (HOSPITAL_BASED_OUTPATIENT_CLINIC_OR_DEPARTMENT_OTHER): Payer: 59 | Attending: Pulmonary Disease | Admitting: Radiology

## 2015-02-05 DIAGNOSIS — Z9989 Dependence on other enabling machines and devices: Principal | ICD-10-CM

## 2015-02-05 DIAGNOSIS — G4733 Obstructive sleep apnea (adult) (pediatric): Secondary | ICD-10-CM

## 2015-02-20 ENCOUNTER — Other Ambulatory Visit: Payer: Self-pay | Admitting: Internal Medicine

## 2015-02-20 NOTE — Telephone Encounter (Signed)
Ok to fill per [facility]red 

## 2015-06-13 ENCOUNTER — Other Ambulatory Visit: Payer: Self-pay | Admitting: Internal Medicine

## 2015-07-07 ENCOUNTER — Other Ambulatory Visit: Payer: Self-pay | Admitting: Internal Medicine

## 2015-08-05 ENCOUNTER — Ambulatory Visit (INDEPENDENT_AMBULATORY_CARE_PROVIDER_SITE_OTHER): Payer: 59 | Admitting: Ophthalmology

## 2015-08-11 ENCOUNTER — Other Ambulatory Visit: Payer: Self-pay | Admitting: Internal Medicine

## 2015-09-11 ENCOUNTER — Other Ambulatory Visit: Payer: Self-pay | Admitting: Internal Medicine

## 2015-09-24 ENCOUNTER — Ambulatory Visit (INDEPENDENT_AMBULATORY_CARE_PROVIDER_SITE_OTHER): Payer: 59 | Admitting: Internal Medicine

## 2015-09-24 ENCOUNTER — Encounter: Payer: Self-pay | Admitting: Internal Medicine

## 2015-09-24 VITALS — BP 108/60 | HR 58 | Ht 72.0 in | Wt 196.2 lb

## 2015-09-24 DIAGNOSIS — R001 Bradycardia, unspecified: Secondary | ICD-10-CM

## 2015-09-24 NOTE — Patient Instructions (Signed)
Medication Instructions:  Your physician recommends that you continue on your current medications as directed. Please refer to the Current Medication list given to you today.   Labwork: None ordered   Testing/Procedures: None ordered   Follow-Up: Your physician wants you to follow-up in: 12 months with Dr Rayann Heman Dennis Bast will receive a reminder letter in the mail two months in advance. If you don't receive a letter, please call our office to schedule the follow-up appointment.  Remote monitoring is used to monitor your Pacemaker  from home. This monitoring reduces the number of office visits required to check your device to one time per year. It allows [place] to keep an eye on the functioning of your device to ensure it is working properly. You are scheduled for a device check from home on 12/24/15. You may send your transmission at any time that day. If you have a wireless device, the transmission will be sent automatically. After your physician reviews your transmission, you will receive a postcard with your next transmission date.    Any Other Special Instructions Will Be Listed Below (If Applicable).     If you need a refill on your cardiac medications before your next appointment, please call your pharmacy.

## 2015-09-26 NOTE — Progress Notes (Signed)
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  Since last being seen in our clinic, the patient reports doing very well.  Today, he denies symptoms of palpitations, chest pain, shortness of breath,  lower extremity edema, dizziness, presyncope, or syncope.  The patient is otherwise without complaint today.   Past Medical History  Diagnosis Date  . Syncope 2008    dysautonomia  . Myopia   . Dyslipidemia   . Bradycardia 01/01/03    s/p PPM (MDT)  . Sleep apnea 9/12    severe  . Abnormal heart rhythm     PACEMAKER   Past Surgical History  Procedure Laterality Date  . Pacemaker placement  01/01/03    generator change (MDT) by Dr Rayann Heman 2012  . Cardiac catheterization  2008    no CAD    ROS- all systems are reviewed and negative except as per HPI above  Current Outpatient Prescriptions  Medication Sig Dispense Refill  . atorvastatin (LIPITOR) 10 MG tablet TAKE 1 TABLET (10 MG TOTAL) BY MOUTH DAILY.... MAX OF 30 DAYS ON INSURANCE 30 tablet 0  . loperamide (IMODIUM) 2 MG capsule Take 2 mg by mouth 4 (four) times daily as needed.      . metoprolol succinate (TOPROL-XL) 50 MG 24 hr tablet TAKE 1 TABLET (50 MG TOTAL) BY MOUTH DAILY. 60 tablet 8  . Multiple Vitamin (MULTIVITAMIN) capsule Take 1 capsule by mouth daily.      Marland Kitchen PARoxetine (PAXIL) 20 MG tablet TAKE 1 TABLET BY MOUTH EVERY DAY 90 tablet 0   No current facility-administered medications for this visit.    Physical Exam: Filed Vitals:   09/24/15 1603  BP: 108/60  Pulse: 58  Height: 6' (1.829 m)  Weight: 196 lb 3.2 oz (88.996 kg)  SpO2: 98%    GEN- The patient is well appearing, alert and oriented x 3 today.   Head- normocephalic, atraumatic Eyes-  Sclera clear, conjunctiva pink Ears- hearing intact Oropharynx- clear Lungs- Clear to ausculation bilaterally, normal work of breathing Chest- pacemaker pocket is well healed Heart- Regular rate and rhythm, no  murmurs, rubs or gallops, PMI not laterally displaced GI- soft, NT, ND, + BS Extremities- no clubbing, cyanosis, or edema  Pacemaker interrogation- reviewed in detail today,  See PACEART report  Assessment and Plan:  1. Symptomatic sinus bradycardia/ dysautonomia Normal pacemaker function See Pace Art report No changes today  2. Sleep apnea Follow-up with Pulm   Return to see the E NP in 1 year [facility]  Thompson Grayer, MD

## 2015-10-09 LAB — CUP PACEART INCLINIC DEVICE CHECK
Battery Impedance: 205 Ohm
Battery Remaining Longevity: 138 mo
Battery Voltage: 2.79 V
Brady Statistic AP VP Percent: 3 %
Brady Statistic AP VS Percent: 0 %
Brady Statistic AS VP Percent: 0 %
Brady Statistic AS VS Percent: 97 %
Date Time Interrogation Session: 20161116211403
Implantable Lead Implant Date: 20040329
Implantable Lead Implant Date: 20040329
Implantable Lead Location: 753859
Implantable Lead Location: 753860
Implantable Lead Model: 4092
Implantable Lead Model: 4592
Lead Channel Impedance Value: 514 Ohm
Lead Channel Impedance Value: 622 Ohm
Lead Channel Pacing Threshold Amplitude: 0.5 V
Lead Channel Pacing Threshold Amplitude: 0.75 V
Lead Channel Pacing Threshold Pulse Width: 0.4 ms
Lead Channel Pacing Threshold Pulse Width: 0.4 ms
Lead Channel Sensing Intrinsic Amplitude: 2.8 mV
Lead Channel Sensing Intrinsic Amplitude: 8 mV
Lead Channel Setting Pacing Amplitude: 2 V
Lead Channel Setting Pacing Amplitude: 2.5 V
Lead Channel Setting Pacing Pulse Width: 0.4 ms
Lead Channel Setting Sensing Sensitivity: 2.8 mV

## 2015-10-12 ENCOUNTER — Other Ambulatory Visit: Payer: Self-pay | Admitting: Internal Medicine

## 2015-10-13 ENCOUNTER — Telehealth: Payer: Self-pay | Admitting: Internal Medicine

## 2015-10-13 MED ORDER — SILDENAFIL CITRATE 50 MG PO TABS: 50.0000 mg | ORAL_TABLET | Freq: Every day | ORAL | Status: DC | PRN

## 2015-10-13 NOTE — Telephone Encounter (Signed)
New message    Patient calling - Dr Rayann Heman and him were  discussing medication  When he was in the office on  11.16.2016

## 2015-10-13 NOTE — Telephone Encounter (Signed)
Patient says he discussed with Dr Rayann Heman a medication to help with ED.  I have asked Dr Rayann Heman and he suggested to call in Viagra 50mg  to take as needed.  Rx sent in.

## 2015-12-10 ENCOUNTER — Other Ambulatory Visit: Payer: Self-pay | Admitting: Internal Medicine

## 2015-12-12 ENCOUNTER — Other Ambulatory Visit: Payer: Self-pay | Admitting: *Deleted

## 2015-12-12 MED ORDER — ATORVASTATIN CALCIUM 10 MG PO TABS: ORAL_TABLET | ORAL | Status: DC

## 2015-12-12 NOTE — Telephone Encounter (Signed)
New message       *STAT* If patient is at the pharmacy, call can be transferred to refill team.   1. Which medications need to be refilled? (please list name of each medication and dose if known) paroxetine 20mg  2. Which pharmacy/location (including street and city if local pharmacy) is medication to be sent to?[facility] [address]. Do they need a 30 day or 90 day supply? 90 day

## 2015-12-12 NOTE — Telephone Encounter (Signed)
Ok to fill 

## 2015-12-12 NOTE — Telephone Encounter (Signed)
Patient is calling back to see if Dr Rayann Heman will refill his Paxil.

## 2015-12-12 NOTE — Telephone Encounter (Signed)
Patient requests refill on atorvastatin. He has not had a lipid panel since 08/16/14. He also requests paxil. Please advise. Thanks, MI

## 2015-12-24 ENCOUNTER — Ambulatory Visit (INDEPENDENT_AMBULATORY_CARE_PROVIDER_SITE_OTHER): Payer: 59 | Admitting: *Deleted

## 2015-12-24 ENCOUNTER — Telehealth: Payer: Self-pay | Admitting: Cardiology

## 2015-12-24 DIAGNOSIS — R001 Bradycardia, unspecified: Secondary | ICD-10-CM | POA: Diagnosis not present

## 2015-12-24 NOTE — Telephone Encounter (Signed)
Spoke with pt and reminded pt of remote transmission that is due today. Pt verbalized understanding.   

## 2015-12-26 NOTE — Progress Notes (Signed)
Remote pacemaker transmission.   

## 2015-12-29 LAB — CUP PACEART REMOTE DEVICE CHECK
Battery Impedance: 206 Ohm
Battery Remaining Longevity: 137 mo
Battery Voltage: 2.79 V
Brady Statistic AP VP Percent: 9 %
Brady Statistic AP VS Percent: 0 %
Brady Statistic AS VP Percent: 0 %
Brady Statistic AS VS Percent: 91 %
Date Time Interrogation Session: 20170216220846
Implantable Lead Implant Date: 20040329
Implantable Lead Implant Date: 20040329
Implantable Lead Location: 753859
Implantable Lead Location: 753860
Implantable Lead Model: 4092
Implantable Lead Model: 4592
Lead Channel Impedance Value: 521 Ohm
Lead Channel Impedance Value: 591 Ohm
Lead Channel Pacing Threshold Amplitude: 0.625 V
Lead Channel Pacing Threshold Pulse Width: 0.4 ms
Lead Channel Sensing Intrinsic Amplitude: 2.8 mV
Lead Channel Sensing Intrinsic Amplitude: 5.6 mV
Lead Channel Setting Pacing Amplitude: 2 V
Lead Channel Setting Pacing Amplitude: 2.5 V
Lead Channel Setting Pacing Pulse Width: 0.4 ms
Lead Channel Setting Sensing Sensitivity: 2 mV

## 2016-01-02 ENCOUNTER — Encounter: Payer: Self-pay | Admitting: Cardiology

## 2016-03-24 ENCOUNTER — Ambulatory Visit (INDEPENDENT_AMBULATORY_CARE_PROVIDER_SITE_OTHER): Payer: 59 | Admitting: *Deleted

## 2016-03-24 ENCOUNTER — Telehealth: Payer: Self-pay | Admitting: Cardiology

## 2016-03-24 DIAGNOSIS — R001 Bradycardia, unspecified: Secondary | ICD-10-CM

## 2016-03-24 NOTE — Telephone Encounter (Signed)
Spoke with pt and reminded pt of remote transmission that is due today. Pt verbalized understanding.   

## 2016-03-26 NOTE — Progress Notes (Signed)
Remote pacemaker transmission.   

## 2016-04-14 ENCOUNTER — Encounter: Payer: Self-pay | Admitting: Cardiology

## 2016-04-15 LAB — CUP PACEART REMOTE DEVICE CHECK
Battery Impedance: 229 Ohm
Battery Remaining Longevity: 133 mo
Battery Voltage: 2.79 V
Brady Statistic AP VP Percent: 6 %
Brady Statistic AP VS Percent: 0 %
Brady Statistic AS VP Percent: 0 %
Brady Statistic AS VS Percent: 94 %
Date Time Interrogation Session: 20170517214442
Implantable Lead Implant Date: 20040329
Implantable Lead Implant Date: 20040329
Implantable Lead Location: 753859
Implantable Lead Location: 753860
Implantable Lead Model: 4092
Implantable Lead Model: 4592
Lead Channel Impedance Value: 478 Ohm
Lead Channel Impedance Value: 566 Ohm
Lead Channel Pacing Threshold Amplitude: 0.625 V
Lead Channel Pacing Threshold Pulse Width: 0.4 ms
Lead Channel Sensing Intrinsic Amplitude: 2.8 mV
Lead Channel Sensing Intrinsic Amplitude: 5.6 mV
Lead Channel Setting Pacing Amplitude: 2 V
Lead Channel Setting Pacing Amplitude: 2.5 V
Lead Channel Setting Pacing Pulse Width: 0.4 ms
Lead Channel Setting Sensing Sensitivity: 2 mV

## 2016-06-23 ENCOUNTER — Encounter: Payer: 59 | Admitting: *Deleted

## 2016-06-23 ENCOUNTER — Telehealth: Payer: Self-pay | Admitting: Cardiology

## 2016-06-23 NOTE — Telephone Encounter (Signed)
LMOVM reminding pt to send remote transmission.   

## 2016-06-25 ENCOUNTER — Encounter: Payer: Self-pay | Admitting: Cardiology

## 2016-07-01 ENCOUNTER — Ambulatory Visit (INDEPENDENT_AMBULATORY_CARE_PROVIDER_SITE_OTHER): Payer: 59 | Admitting: *Deleted

## 2016-07-01 DIAGNOSIS — R001 Bradycardia, unspecified: Secondary | ICD-10-CM | POA: Diagnosis not present

## 2016-07-01 DIAGNOSIS — R55 Syncope and collapse: Secondary | ICD-10-CM

## 2016-07-02 NOTE — Progress Notes (Signed)
Remote pacemaker transmission.   

## 2016-07-08 ENCOUNTER — Encounter: Payer: Self-pay | Admitting: Cardiology

## 2016-07-14 LAB — CUP PACEART REMOTE DEVICE CHECK
Battery Impedance: 277 Ohm
Battery Remaining Longevity: 126 mo
Battery Voltage: 2.79 V
Brady Statistic AP VP Percent: 5 %
Brady Statistic AP VS Percent: 0 %
Brady Statistic AS VP Percent: 0 %
Brady Statistic AS VS Percent: 95 %
Date Time Interrogation Session: 20170824150522
Implantable Lead Implant Date: 20040329
Implantable Lead Implant Date: 20040329
Implantable Lead Location: 753859
Implantable Lead Location: 753860
Implantable Lead Model: 4092
Implantable Lead Model: 4592
Lead Channel Impedance Value: 463 Ohm
Lead Channel Impedance Value: 603 Ohm
Lead Channel Pacing Threshold Amplitude: 0.625 V
Lead Channel Pacing Threshold Pulse Width: 0.4 ms
Lead Channel Sensing Intrinsic Amplitude: 2.8 mV
Lead Channel Sensing Intrinsic Amplitude: 5.6 mV
Lead Channel Setting Pacing Amplitude: 2 V
Lead Channel Setting Pacing Amplitude: 2.5 V
Lead Channel Setting Pacing Pulse Width: 0.4 ms
Lead Channel Setting Sensing Sensitivity: 2.8 mV

## 2016-09-07 ENCOUNTER — Other Ambulatory Visit: Payer: Self-pay | Admitting: Internal Medicine

## 2016-09-07 NOTE — Telephone Encounter (Signed)
Okay to refill? 

## 2016-09-07 NOTE — Telephone Encounter (Signed)
Ok to fill 

## 2016-09-14 ENCOUNTER — Encounter: Payer: Self-pay | Admitting: Nurse Practitioner

## 2016-09-14 NOTE — Progress Notes (Signed)
Electrophysiology Office Note Date: 09/15/2016  ID:  Tracy [name], DOB [birth year], MRN [id number]  PCP: Orpah Melter, MD Electrophysiologist: Rayann Heman  CC: Pacemaker follow-up  Tracy [name] is a 60 y.o. male seen today for Dr Rayann Heman.  He presents today for routine electrophysiology followup.  Since last being seen in our clinic, the patient reports doing very well.  He denies chest pain, palpitations, dyspnea, PND, orthopnea, nausea, vomiting, dizziness, syncope, edema, weight gain, or early satiety.  Device History: MDT dual chamber PPM implanted 2004 for SSS; gen change 2012   Past Medical History:  Diagnosis Date  . Bradycardia 01/01/03   s/p PPM (MDT)  . Dyslipidemia   . Myopia   . Sleep apnea 9/12   severe  . Syncope 2008   dysautonomia   Past Surgical History:  Procedure Laterality Date  . CARDIAC CATHETERIZATION  2008   no CAD  . PACEMAKER PLACEMENT  01/01/03   generator change (MDT) by Dr Rayann Heman 2012    Current Outpatient Prescriptions  Medication Sig Dispense Refill  . atorvastatin (LIPITOR) 10 MG tablet TAKE 1 TABLET (10 MG TOTAL) BY MOUTH DAILY.... MAX OF 30 DAYS ON INSURANCE 30 tablet 8  . loperamide (IMODIUM) 2 MG capsule Take 2 mg by mouth 4 (four) times daily as needed.      . metoprolol succinate (TOPROL-XL) 50 MG 24 hr tablet TAKE 1 TABLET (50 MG TOTAL) BY MOUTH DAILY. 60 tablet 8  . Multiple Vitamin (MULTIVITAMIN) capsule Take 1 capsule by mouth daily.      Marland Kitchen PARoxetine (PAXIL) 20 MG tablet TAKE 1 TABLET BY MOUTH EVERY DAY 90 tablet 0  . sildenafil (VIAGRA) 50 MG tablet Take 1 tablet (50 mg total) by mouth daily as needed for erectile dysfunction. 20 tablet 1   No current facility-administered medications for this visit.     Allergies:   Patient has no known allergies.   Social History: Social History   Social History  . Marital status: Married    Spouse name: N/A  . Number of children: N/A  . Years of education: N/A   Occupational History   . Account Manager [employer]   Social History Main Topics  . Smoking status: Current Every Day Smoker    Years: 1.00    Types: Cigars  . Smokeless tobacco: Not on file     Comment: 3 cigars a week  . Alcohol use 0.0 oz/week     Comment: beer-- 1-2 daily  . Drug use: No  . Sexual activity: Not on file   Other Topics Concern  . Not on file   Social History Narrative  . No narrative on file    Family History: Family History  Problem Relation Age of Onset  . Heart disease Other      Review of Systems: All other systems reviewed and are otherwise negative except as noted above.   Physical Exam: VS:  BP 114/72 (BP Location: Left Arm, Patient Position: Sitting, Cuff Size: Large)   Pulse 60   Ht 6' (1.829 m)   Wt 192 lb 12.8 oz (87.5 kg)   SpO2 98%   BMI 26.15 kg/m  , BMI Body mass index is 26.15 kg/m.  GEN- The patient is well appearing, alert and oriented x 3 today.   HEENT: normocephalic, atraumatic; sclera clear, conjunctiva pink; hearing intact; oropharynx clear; neck supple, no JVP Lymph- no cervical lymphadenopathy Lungs- Clear to ausculation bilaterally, normal work of breathing.  No wheezes,  rales, rhonchi Heart- Regular rate and rhythm, no murmurs, rubs or gallops, PMI not laterally displaced GI- soft, non-tender, non-distended, bowel sounds present, no hepatosplenomegaly Extremities- no clubbing, cyanosis, or edema; DP/PT/radial pulses 2+ bilaterally MS- no significant deformity or atrophy Skin- warm and dry, no rash or lesion; PPM pocket well healed Psych- euthymic mood, full affect Neuro- strength and sensation are intact  PPM Interrogation- reviewed in detail today,  See PACEART report  EKG:  EKG is ordered today. The ekg ordered today shows sinus bradycardia, rate 54  Recent Labs: No results found for requested labs within last 8760 hours.   Wt Readings from Last 3 Encounters:  09/15/16 192 lb 12.8 oz (87.5 kg)    09/24/15 196 lb 3.2 oz (89 kg)  02/03/15 213 lb 3.2 oz (96.7 kg)     Other studies Reviewed: Additional studies/ records that were reviewed today include: Dr Jackalyn Lombard office notes  Assessment and Plan:  1.  Symptomatic bradycardia/dysautonomia Normal PPM function See Pace Art report No changes today  2.  OSA Compliance with CPAP recommended Follow up with pulmonary as scheduled   Current medicines are reviewed at length with the patient today.   The patient does not have concerns regarding his medicines.  The following changes were made today:  none  Labs/ tests ordered today include: none No orders of the defined types were placed in this encounter.    Disposition:   Follow up with [facility], Dr Rayann Heman 1 year    Signed, Chanetta Marshall, NP 09/15/2016 9:02 AM  [facility] [place] [phone] (office) [phone] (fax)

## 2016-09-15 ENCOUNTER — Encounter (INDEPENDENT_AMBULATORY_CARE_PROVIDER_SITE_OTHER): Payer: Self-pay

## 2016-09-15 ENCOUNTER — Ambulatory Visit (INDEPENDENT_AMBULATORY_CARE_PROVIDER_SITE_OTHER): Payer: 59 | Admitting: Nurse Practitioner

## 2016-09-15 ENCOUNTER — Encounter: Payer: Self-pay | Admitting: Nurse Practitioner

## 2016-09-15 VITALS — BP 114/72 | HR 60 | Ht 72.0 in | Wt 192.8 lb

## 2016-09-15 DIAGNOSIS — G4733 Obstructive sleep apnea (adult) (pediatric): Secondary | ICD-10-CM | POA: Diagnosis not present

## 2016-09-15 DIAGNOSIS — Z9989 Dependence on other enabling machines and devices: Secondary | ICD-10-CM | POA: Diagnosis not present

## 2016-09-15 DIAGNOSIS — G901 Familial dysautonomia [Riley-Day]: Secondary | ICD-10-CM

## 2016-09-15 DIAGNOSIS — G909 Disorder of the autonomic nervous system, unspecified: Secondary | ICD-10-CM | POA: Diagnosis not present

## 2016-09-15 DIAGNOSIS — R001 Bradycardia, unspecified: Secondary | ICD-10-CM

## 2016-09-15 LAB — CUP PACEART INCLINIC DEVICE CHECK
Date Time Interrogation Session: 20171108090406
Implantable Lead Implant Date: 20040329
Implantable Lead Implant Date: 20040329
Implantable Lead Location: 753859
Implantable Lead Location: 753860
Implantable Lead Model: 4092
Implantable Lead Model: 4592
Implantable Pulse Generator Implant Date: 20120918

## 2016-09-15 NOTE — Patient Instructions (Addendum)
Medication Instructions:   Your physician recommends that you continue on your current medications as directed. Please refer to the Current Medication list given to you today.   If you need a refill on your cardiac medications before your next appointment, please call your pharmacy.  Labwork: NONE ORDERED  TODAY    Testing/Procedures: NONE ORDERED  TODAY    Follow-Up: Your physician wants you to follow-up in: [place] will receive a reminder letter in the mail two months in advance. If you don't receive a letter, please call our office to schedule the follow-up appointment.   Remote monitoring is used to monitor your Pacemaker of ICD from home. This monitoring reduces the number of office visits required to check your device to one time per year. It allows [place] to keep an eye on the functioning of your device to ensure it is working properly. You are scheduled for a device check from home on .12/15/16.You may send your transmission at any time that day. If you have a wireless device, the transmission will be sent automatically. After your physician reviews your transmission, you will receive a postcard with your next transmission date  Any Other Special Instructions Will Be Listed Below (If Applicable).

## 2016-12-15 ENCOUNTER — Ambulatory Visit (INDEPENDENT_AMBULATORY_CARE_PROVIDER_SITE_OTHER): Payer: 59 | Admitting: *Deleted

## 2016-12-15 ENCOUNTER — Telehealth: Payer: Self-pay | Admitting: Cardiology

## 2016-12-15 DIAGNOSIS — R001 Bradycardia, unspecified: Secondary | ICD-10-CM | POA: Diagnosis not present

## 2016-12-15 NOTE — Telephone Encounter (Signed)
LMOVM reminding pt to send remote transmission.   

## 2016-12-16 NOTE — Progress Notes (Signed)
Remote pacemaker transmission.   

## 2016-12-17 ENCOUNTER — Encounter: Payer: Self-pay | Admitting: Cardiology

## 2016-12-17 LAB — CUP PACEART REMOTE DEVICE CHECK
Battery Impedance: 277 Ohm
Battery Remaining Longevity: 123 mo
Battery Voltage: 2.79 V
Brady Statistic AP VP Percent: 13 %
Brady Statistic AP VS Percent: 0 %
Brady Statistic AS VP Percent: 0 %
Brady Statistic AS VS Percent: 87 %
Date Time Interrogation Session: 20180207205441
Implantable Lead Implant Date: 20040329
Implantable Lead Implant Date: 20040329
Implantable Lead Location: 753859
Implantable Lead Location: 753860
Implantable Lead Model: 4092
Implantable Lead Model: 4592
Implantable Pulse Generator Implant Date: 20120918
Lead Channel Impedance Value: 499 Ohm
Lead Channel Impedance Value: 591 Ohm
Lead Channel Pacing Threshold Amplitude: 0.75 V
Lead Channel Pacing Threshold Pulse Width: 0.4 ms
Lead Channel Setting Pacing Amplitude: 2 V
Lead Channel Setting Pacing Amplitude: 2.5 V
Lead Channel Setting Pacing Pulse Width: 0.4 ms
Lead Channel Setting Sensing Sensitivity: 2.8 mV

## 2016-12-25 ENCOUNTER — Other Ambulatory Visit: Payer: Self-pay | Admitting: Internal Medicine

## 2017-03-16 ENCOUNTER — Encounter: Payer: 59 | Admitting: *Deleted

## 2017-03-16 ENCOUNTER — Telehealth: Payer: Self-pay | Admitting: Cardiology

## 2017-03-16 NOTE — Telephone Encounter (Signed)
Spoke with pt and reminded pt of remote transmission that is due today. Pt verbalized understanding.   

## 2017-03-18 ENCOUNTER — Encounter: Payer: Self-pay | Admitting: Cardiology

## 2017-04-05 ENCOUNTER — Ambulatory Visit (INDEPENDENT_AMBULATORY_CARE_PROVIDER_SITE_OTHER): Payer: 59 | Admitting: *Deleted

## 2017-04-05 DIAGNOSIS — R001 Bradycardia, unspecified: Secondary | ICD-10-CM

## 2017-04-05 DIAGNOSIS — R55 Syncope and collapse: Secondary | ICD-10-CM

## 2017-04-06 LAB — CUP PACEART REMOTE DEVICE CHECK
Battery Impedance: 301 Ohm
Battery Remaining Longevity: 120 mo
Battery Voltage: 2.79 V
Brady Statistic AP VP Percent: 12 %
Brady Statistic AP VS Percent: 0 %
Brady Statistic AS VP Percent: 0 %
Brady Statistic AS VS Percent: 88 %
Date Time Interrogation Session: 20180525195318
Implantable Lead Implant Date: 20040329
Implantable Lead Implant Date: 20040329
Implantable Lead Location: 753859
Implantable Lead Location: 753860
Implantable Lead Model: 4092
Implantable Lead Model: 4592
Implantable Pulse Generator Implant Date: 20120918
Lead Channel Impedance Value: 484 Ohm
Lead Channel Impedance Value: 578 Ohm
Lead Channel Pacing Threshold Amplitude: 0.625 V
Lead Channel Pacing Threshold Pulse Width: 0.4 ms
Lead Channel Setting Pacing Amplitude: 2 V
Lead Channel Setting Pacing Amplitude: 2.5 V
Lead Channel Setting Pacing Pulse Width: 0.4 ms
Lead Channel Setting Sensing Sensitivity: 2 mV

## 2017-04-15 ENCOUNTER — Encounter: Payer: Self-pay | Admitting: Cardiology

## 2017-05-31 ENCOUNTER — Other Ambulatory Visit: Payer: Self-pay | Admitting: Internal Medicine

## 2017-05-31 NOTE — Telephone Encounter (Signed)
Ok to fill 

## 2017-05-31 NOTE — Telephone Encounter (Signed)
Pharmacy requesting a refill on Paroxetine 20 mg tablet. Would you like to refill this medication? Please advise

## 2017-07-05 ENCOUNTER — Encounter: Payer: 59 | Admitting: *Deleted

## 2017-07-08 ENCOUNTER — Encounter: Payer: Self-pay | Admitting: Cardiology

## 2017-08-05 ENCOUNTER — Ambulatory Visit (INDEPENDENT_AMBULATORY_CARE_PROVIDER_SITE_OTHER): Payer: 59 | Admitting: *Deleted

## 2017-08-05 DIAGNOSIS — R001 Bradycardia, unspecified: Secondary | ICD-10-CM

## 2017-08-05 DIAGNOSIS — R55 Syncope and collapse: Secondary | ICD-10-CM

## 2017-08-10 ENCOUNTER — Encounter: Payer: Self-pay | Admitting: Cardiology

## 2017-08-10 NOTE — Progress Notes (Signed)
Remote pacemaker transmission.   

## 2017-08-16 LAB — CUP PACEART REMOTE DEVICE CHECK
Battery Impedance: 374 Ohm
Battery Remaining Longevity: 112 mo
Battery Voltage: 2.79 V
Brady Statistic AP VP Percent: 11 %
Brady Statistic AP VS Percent: 0 %
Brady Statistic AS VP Percent: 0 %
Brady Statistic AS VS Percent: 88 %
Date Time Interrogation Session: 20180928151339
Implantable Lead Implant Date: 20040329
Implantable Lead Implant Date: 20040329
Implantable Lead Location: 753859
Implantable Lead Location: 753860
Implantable Lead Model: 4092
Implantable Lead Model: 4592
Implantable Pulse Generator Implant Date: 20120918
Lead Channel Impedance Value: 470 Ohm
Lead Channel Impedance Value: 560 Ohm
Lead Channel Pacing Threshold Amplitude: 0.625 V
Lead Channel Pacing Threshold Pulse Width: 0.4 ms
Lead Channel Setting Pacing Amplitude: 2 V
Lead Channel Setting Pacing Amplitude: 2.5 V
Lead Channel Setting Pacing Pulse Width: 0.4 ms
Lead Channel Setting Sensing Sensitivity: 2 mV

## 2017-08-30 ENCOUNTER — Other Ambulatory Visit: Payer: Self-pay | Admitting: Internal Medicine

## 2017-09-19 ENCOUNTER — Ambulatory Visit (INDEPENDENT_AMBULATORY_CARE_PROVIDER_SITE_OTHER): Payer: 59 | Admitting: Internal Medicine

## 2017-09-19 ENCOUNTER — Encounter: Payer: Self-pay | Admitting: Internal Medicine

## 2017-09-19 VITALS — BP 124/78 | HR 76 | Ht 72.0 in | Wt 192.0 lb

## 2017-09-19 DIAGNOSIS — R55 Syncope and collapse: Secondary | ICD-10-CM | POA: Diagnosis not present

## 2017-09-19 DIAGNOSIS — R001 Bradycardia, unspecified: Secondary | ICD-10-CM | POA: Diagnosis not present

## 2017-09-19 LAB — CUP PACEART INCLINIC DEVICE CHECK
Battery Impedance: 325 Ohm
Battery Remaining Longevity: 118 mo
Battery Voltage: 2.78 V
Brady Statistic AP VP Percent: 11 %
Brady Statistic AP VS Percent: 0 %
Brady Statistic AS VP Percent: 0 %
Brady Statistic AS VS Percent: 89 %
Date Time Interrogation Session: 20181112134607
Implantable Lead Implant Date: 20040329
Implantable Lead Implant Date: 20040329
Implantable Lead Location: 753859
Implantable Lead Location: 753860
Implantable Lead Model: 4092
Implantable Lead Model: 4592
Implantable Pulse Generator Implant Date: 20120918
Lead Channel Impedance Value: 476 Ohm
Lead Channel Impedance Value: 600 Ohm
Lead Channel Pacing Threshold Amplitude: 0.5 V
Lead Channel Pacing Threshold Amplitude: 0.625 V
Lead Channel Pacing Threshold Amplitude: 0.75 V
Lead Channel Pacing Threshold Pulse Width: 0.4 ms
Lead Channel Pacing Threshold Pulse Width: 0.4 ms
Lead Channel Pacing Threshold Pulse Width: 0.4 ms
Lead Channel Sensing Intrinsic Amplitude: 4 mV
Lead Channel Sensing Intrinsic Amplitude: 5.6 mV
Lead Channel Setting Pacing Amplitude: 2 V
Lead Channel Setting Pacing Amplitude: 2.5 V
Lead Channel Setting Pacing Pulse Width: 0.4 ms
Lead Channel Setting Sensing Sensitivity: 2.8 mV

## 2017-09-19 NOTE — Progress Notes (Signed)
    PCP: Orpah Melter, MD   Primary EP:  Dr Rayann Heman  Carter Kassel is a 61 y.o. male who presents today for routine electrophysiology followup.  Since last being seen in our clinic, the patient reports doing very well.  Today, he denies symptoms of palpitations, chest pain, shortness of breath,  lower extremity edema, dizziness, presyncope, or syncope.  The patient is otherwise without complaint today.   Past Medical History:  Diagnosis Date  . Bradycardia 01/01/03   s/p PPM (MDT)  . Dyslipidemia   . Myopia   . OSA (obstructive sleep apnea) 07/21/2011   NPSG 2012:  AHI 88/hr with both obstructive and central events.  Auto 10/2013:  Optimal pressure 16cm.    . Sleep apnea 9/12   severe  . Syncope 2008   dysautonomia   Past Surgical History:  Procedure Laterality Date  . CARDIAC CATHETERIZATION  2008   no CAD  . PACEMAKER PLACEMENT  01/01/03   generator change (MDT) by Dr Rayann Heman 2012    ROS- all systems are reviewed and negative except as per HPI above  Current Outpatient Medications  Medication Sig Dispense Refill  . atorvastatin (LIPITOR) 10 MG tablet TAKE 1 TABLET (10 MG TOTAL) BY MOUTH DAILY.... MAX OF 30 DAYS ON INSURANCE 30 tablet 8  . loperamide (IMODIUM) 2 MG capsule Take 2 mg by mouth 4 (four) times daily as needed.      . metoprolol succinate (TOPROL-XL) 50 MG 24 hr tablet TAKE 1 TABLET (50 MG TOTAL) BY MOUTH DAILY. 60 tablet 8  . Multiple Vitamin (MULTIVITAMIN) capsule Take 1 capsule by mouth daily.      Marland Kitchen PARoxetine (PAXIL) 20 MG tablet Take 1 tablet (20 mg total) by mouth daily. Please keep upcoming appt for future refills. 90 tablet 0  . sildenafil (VIAGRA) 50 MG tablet Take 1 tablet (50 mg total) by mouth daily as needed for erectile dysfunction. 20 tablet 1   No current facility-administered medications for this visit.     Physical Exam: Vitals:   09/19/17 0804  BP: 124/78  Pulse: 76  SpO2: 99%  Weight: 192 lb (87.1 kg)  Height: 6' (1.829 m)    GEN-  The patient is well appearing, alert and oriented x 3 today.   Head- normocephalic, atraumatic Eyes-  Sclera clear, conjunctiva pink Ears- hearing intact Oropharynx- clear Lungs- Clear to ausculation bilaterally, normal work of breathing Chest- pacemaker pocket is well healed Heart- Regular rate and rhythm, no murmurs, rubs or gallops, PMI not laterally displaced GI- soft, NT, ND, + BS Extremities- no clubbing, cyanosis, or edema  Pacemaker interrogation- reviewed in detail today,  See PACEART report  ekg tracing ordered today is personally reviewed and shows sinus rhythm with PVCs, otherwise normal ekg  Assessment and Plan:  1. Symptomatic sinus bradycardia/ dysautonomia Normal pacemaker function See Pace Art report No changes today  2. Sleep apnea Followed by pulmonary Reports compliance with CPAP  3. PVCs Asymptomatic  4. HL Lipids followed by PCP  [facility] Return to see EP NP every year  Thompson Grayer MD, [facility] 09/19/2017 8:20 AM

## 2017-09-19 NOTE — Patient Instructions (Addendum)
Medication Instructions:  Your physician recommends that you continue on your current medications as directed. Please refer to the Current Medication list given to you today.  -- If you need a refill on your cardiac medications before your next appointment, please call your pharmacy. --  Labwork: None ordered   Testing/Procedures: None ordered  Follow-Up: Your physician wants you to follow-up in: 1 year with Chanetta Marshall NP.  You will receive a reminder letter in the mail two months in advance. If you don't receive a letter, please call our office to schedule the follow-up appointment.  Remote monitoring is used to monitor your Pacemaker from home. This monitoring reduces the number of office visits required to check your device to one time per year. It allows [place] to keep an eye on the functioning of your device to ensure it is working properly. You are scheduled for a device check from home on 11/09/2017. You may send your transmission at any time that day. If you have a wireless device, the transmission will be sent automatically. After your physician reviews your transmission, you will receive a postcard with your next transmission date.    Thank you for choosing [facility]!!   Frederik Schmidt, RN [phone]  Any Other Special Instructions Will Be Listed Below (If Applicable).

## 2017-10-02 ENCOUNTER — Other Ambulatory Visit: Payer: Self-pay | Admitting: Internal Medicine

## 2017-11-09 ENCOUNTER — Telehealth: Payer: Self-pay | Admitting: Cardiology

## 2017-11-09 ENCOUNTER — Ambulatory Visit (INDEPENDENT_AMBULATORY_CARE_PROVIDER_SITE_OTHER): Payer: 59 | Admitting: *Deleted

## 2017-11-09 DIAGNOSIS — R55 Syncope and collapse: Secondary | ICD-10-CM

## 2017-11-09 DIAGNOSIS — R001 Bradycardia, unspecified: Secondary | ICD-10-CM

## 2017-11-09 NOTE — Telephone Encounter (Signed)
LMOVM reminding pt to send remote transmission.   

## 2017-11-10 NOTE — Progress Notes (Signed)
Remote pacemaker transmission.   

## 2017-11-11 ENCOUNTER — Encounter: Payer: Self-pay | Admitting: Cardiology

## 2017-11-11 NOTE — Progress Notes (Signed)
Letter  

## 2017-11-14 ENCOUNTER — Telehealth: Payer: Self-pay | Admitting: Internal Medicine

## 2017-11-14 NOTE — Telephone Encounter (Signed)
New Message  Pt call stating he very lightheaded on 1/6. Pt was made an appt for 1/23 with Dr. Rayann Heman. Please call back to discuss if needed

## 2017-11-14 NOTE — Telephone Encounter (Signed)
Spoke with pt and pt only had the 1 episode feels okay now  B/p was 110/52  heart rate was 64. Per pt episode just happened did not occur with position change has appt.for 1/23. Will forward to Dr Rayann Heman for review.Pt is okay with this appt and will call if another episode occurs./cy

## 2017-11-17 LAB — CUP PACEART REMOTE DEVICE CHECK
Battery Impedance: 375 Ohm
Battery Remaining Longevity: 114 mo
Battery Voltage: 2.79 V
Brady Statistic AP VP Percent: 8 %
Brady Statistic AP VS Percent: 0 %
Brady Statistic AS VP Percent: 0 %
Brady Statistic AS VS Percent: 92 %
Date Time Interrogation Session: 20190103174339
Implantable Lead Implant Date: 20040329
Implantable Lead Implant Date: 20040329
Implantable Lead Location: 753859
Implantable Lead Location: 753860
Implantable Lead Model: 4092
Implantable Lead Model: 4592
Implantable Pulse Generator Implant Date: 20120918
Lead Channel Impedance Value: 498 Ohm
Lead Channel Impedance Value: 625 Ohm
Lead Channel Pacing Threshold Amplitude: 0.875 V
Lead Channel Pacing Threshold Pulse Width: 0.4 ms
Lead Channel Setting Pacing Amplitude: 2 V
Lead Channel Setting Pacing Amplitude: 2.5 V
Lead Channel Setting Pacing Pulse Width: 0.4 ms
Lead Channel Setting Sensing Sensitivity: 2 mV

## 2017-11-30 ENCOUNTER — Ambulatory Visit (INDEPENDENT_AMBULATORY_CARE_PROVIDER_SITE_OTHER): Payer: 59 | Admitting: Internal Medicine

## 2017-11-30 ENCOUNTER — Other Ambulatory Visit: Payer: Self-pay | Admitting: Internal Medicine

## 2017-11-30 ENCOUNTER — Encounter: Payer: Self-pay | Admitting: Internal Medicine

## 2017-11-30 VITALS — BP 118/62 | HR 78 | Ht 72.0 in | Wt 193.0 lb

## 2017-11-30 DIAGNOSIS — R42 Dizziness and giddiness: Secondary | ICD-10-CM

## 2017-11-30 DIAGNOSIS — R001 Bradycardia, unspecified: Secondary | ICD-10-CM | POA: Diagnosis not present

## 2017-11-30 LAB — CUP PACEART INCLINIC DEVICE CHECK
Battery Impedance: 399 Ohm
Battery Remaining Longevity: 112 mo
Battery Voltage: 2.79 V
Brady Statistic AP VP Percent: 7 %
Brady Statistic AP VS Percent: 0 %
Brady Statistic AS VP Percent: 0 %
Brady Statistic AS VS Percent: 93 %
Date Time Interrogation Session: 20190123172715
Implantable Lead Implant Date: 20040329
Implantable Lead Implant Date: 20040329
Implantable Lead Location: 753859
Implantable Lead Location: 753860
Implantable Lead Model: 4092
Implantable Lead Model: 4592
Implantable Pulse Generator Implant Date: 20120918
Lead Channel Impedance Value: 492 Ohm
Lead Channel Impedance Value: 656 Ohm
Lead Channel Pacing Threshold Amplitude: 0.75 V
Lead Channel Pacing Threshold Amplitude: 1 V
Lead Channel Pacing Threshold Pulse Width: 0.4 ms
Lead Channel Pacing Threshold Pulse Width: 0.4 ms
Lead Channel Sensing Intrinsic Amplitude: 4 mV
Lead Channel Sensing Intrinsic Amplitude: 5.6 mV
Lead Channel Setting Pacing Amplitude: 2 V
Lead Channel Setting Pacing Amplitude: 2.5 V
Lead Channel Setting Pacing Pulse Width: 0.4 ms
Lead Channel Setting Sensing Sensitivity: 2.8 mV

## 2017-11-30 NOTE — Patient Instructions (Addendum)
Medication Instructions:  Your physician recommends that you continue on your current medications as directed. Please refer to the Current Medication list given to you today.   Labwork: None ordered.  Testing/Procedures: None ordered.   Follow-Up: Your physician wants you to follow-up in: One Year with Chanetta Marshall, NP.  Please call our office to schedule the follow-up appointment.  Remote monitoring is used to monitor your Pacemaker from home. This monitoring reduces the number of office visits required to check your device to one time per year. It allows [place] to keep an eye on the functioning of your device to ensure it is working properly. You are scheduled for a device check from home on 02/08/2018. You may send your transmission at any time that day. If you have a wireless device, the transmission will be sent automatically. After your physician reviews your transmission, you will receive a postcard with your next transmission date.    Any Other Special Instructions Will Be Listed Below (If Applicable).     If you need a refill on your cardiac medications before your next appointment, please call your pharmacy.

## 2017-11-30 NOTE — Progress Notes (Signed)
PCP: Orpah Melter, MD   Primary EP: Dr Rayann Heman  Tracy [name] is a 62 y.o. male who presents today for electrophysiology followup.  Since last being seen in our clinic, the patient reports doing reasonably well.  He reports that on 11/15/17 he had dizziness while working in his yard. He felt that he had difficulty concentrating.  He hydrated and had resolution of his symptoms. No other episodes.  He states that his grand daughter had been visiting and had a cold.  He does not recall other symptoms.  No further episodes since that time. Did not have presyncope or syncope.   Today, he denies symptoms of palpitations, chest pain, shortness of breath,  Bleeding, GI bleeding, lower extremity edema, dizziness, presyncope, or syncope.  The patient is otherwise without complaint today.   Past Medical History:  Diagnosis Date  . Bradycardia 01/01/03   s/p PPM (MDT)  . Dyslipidemia   . Myopia   . OSA (obstructive sleep apnea) 07/21/2011   NPSG 2012:  AHI 88/hr with both obstructive and central events.  Auto 10/2013:  Optimal pressure 16cm.    . Sleep apnea 9/12   severe  . Syncope 2008   dysautonomia   Past Surgical History:  Procedure Laterality Date  . CARDIAC CATHETERIZATION  2008   no CAD  . PACEMAKER PLACEMENT  01/01/03   generator change (MDT) by Dr Rayann Heman 2012    ROS- all systems are reviewed and negatives except as per HPI above  Current Outpatient Medications  Medication Sig Dispense Refill  . atorvastatin (LIPITOR) 10 MG tablet TAKE 1 TABLET (10 MG TOTAL) BY MOUTH DAILY.... MAX OF 30 DAYS ON INSURANCE 30 tablet 10  . loperamide (IMODIUM) 2 MG capsule Take 2 mg by mouth 4 (four) times daily as needed.      . metoprolol succinate (TOPROL-XL) 50 MG 24 hr tablet TAKE 1 TABLET (50 MG TOTAL) BY MOUTH DAILY. 60 tablet 8  . Multiple Vitamin (MULTIVITAMIN) capsule Take 1 capsule by mouth daily.      Marland Kitchen PARoxetine (PAXIL) 20 MG tablet Take 1 tablet (20 mg total) by mouth daily. 90 tablet 2    . sildenafil (VIAGRA) 50 MG tablet Take 1 tablet (50 mg total) by mouth daily as needed for erectile dysfunction. 20 tablet 1   No current facility-administered medications for this visit.     Physical Exam: Vitals:   11/30/17 1625  BP: 118/62  Pulse: 78  Weight: 193 lb (87.5 kg)  Height: 6' (1.829 m)    GEN- The patient is thin appearing, alert and oriented x 3 today.   Head- normocephalic, atraumatic Eyes-  Sclera clear, conjunctiva pink Ears- hearing intact Oropharynx- clear Lungs- Clear to ausculation bilaterally, normal work of breathing Heart- Regular rate and rhythm with frequent ectopy, no murmurs, rubs or gallops, PMI not laterally displaced GI- soft, NT, ND, + BS Extremities- no clubbing, cyanosis, or edema  EKG tracing ordered today is personally reviewed and shows sinus rhythm with trigeminal PVCs  Assessment and Plan:  1. Symptomatic sinus bradycardia/ dysautonomia Normal pacemaker function See Pace Art report No changes today  2. PVCs Asymptomatic but frequent  3. Sleep apnea Complaint with CPAP Followed by pulm  4. ? Dizziness on 11/15/17 No arrhythmias to correlate to this event Unclear etiology As symptoms have resolved, he is not interested in blood work which was offered today   If symptoms worsen or return, he will contact our office.  I have filled out his  DMV form which states that I feel that he can drive.  I am unaware of any accidents, presyncope, syncope, or any other new issues in the past year  [facility] Return to see EP PA every year  Thompson Grayer MD, [facility] 11/30/2017 4:35 PM

## 2018-01-03 ENCOUNTER — Other Ambulatory Visit: Payer: Self-pay | Admitting: Internal Medicine

## 2018-02-08 ENCOUNTER — Encounter: Payer: 59 | Admitting: *Deleted

## 2018-02-08 ENCOUNTER — Telehealth: Payer: Self-pay | Admitting: Cardiology

## 2018-02-08 NOTE — Telephone Encounter (Signed)
Spoke with pt and reminded pt of remote transmission that is due today. Pt verbalized understanding.   

## 2018-02-09 ENCOUNTER — Encounter: Payer: Self-pay | Admitting: Cardiology

## 2018-02-13 ENCOUNTER — Ambulatory Visit (INDEPENDENT_AMBULATORY_CARE_PROVIDER_SITE_OTHER): Payer: 59 | Admitting: *Deleted

## 2018-02-13 DIAGNOSIS — R001 Bradycardia, unspecified: Secondary | ICD-10-CM | POA: Diagnosis not present

## 2018-02-14 NOTE — Progress Notes (Signed)
Remote pacemaker transmission.   

## 2018-02-16 ENCOUNTER — Encounter: Payer: Self-pay | Admitting: Cardiology

## 2018-02-28 LAB — CUP PACEART REMOTE DEVICE CHECK
Battery Impedance: 423 Ohm
Battery Remaining Longevity: 109 mo
Battery Voltage: 2.79 V
Brady Statistic AP VP Percent: 9 %
Brady Statistic AP VS Percent: 0 %
Brady Statistic AS VP Percent: 0 %
Brady Statistic AS VS Percent: 90 %
Date Time Interrogation Session: 20190408152348
Implantable Lead Implant Date: 20040329
Implantable Lead Implant Date: 20040329
Implantable Lead Location: 753859
Implantable Lead Location: 753860
Implantable Lead Model: 4092
Implantable Lead Model: 4592
Implantable Pulse Generator Implant Date: 20120918
Lead Channel Impedance Value: 499 Ohm
Lead Channel Impedance Value: 599 Ohm
Lead Channel Pacing Threshold Amplitude: 0.75 V
Lead Channel Pacing Threshold Pulse Width: 0.4 ms
Lead Channel Setting Pacing Amplitude: 2 V
Lead Channel Setting Pacing Amplitude: 2.5 V
Lead Channel Setting Pacing Pulse Width: 0.4 ms
Lead Channel Setting Sensing Sensitivity: 2 mV

## 2018-05-15 ENCOUNTER — Telehealth: Payer: Self-pay | Admitting: Cardiology

## 2018-05-15 ENCOUNTER — Ambulatory Visit (INDEPENDENT_AMBULATORY_CARE_PROVIDER_SITE_OTHER): Payer: 59 | Admitting: *Deleted

## 2018-05-15 DIAGNOSIS — R001 Bradycardia, unspecified: Secondary | ICD-10-CM | POA: Diagnosis not present

## 2018-05-15 LAB — CUP PACEART REMOTE DEVICE CHECK
Battery Impedance: 447 Ohm
Battery Remaining Longevity: 107 mo
Battery Voltage: 2.79 V
Brady Statistic AP VP Percent: 7 %
Brady Statistic AP VS Percent: 0 %
Brady Statistic AS VP Percent: 0 %
Brady Statistic AS VS Percent: 93 %
Date Time Interrogation Session: 20190708230344
Implantable Lead Implant Date: 20040329
Implantable Lead Implant Date: 20040329
Implantable Lead Location: 753859
Implantable Lead Location: 753860
Implantable Lead Model: 4092
Implantable Lead Model: 4592
Implantable Pulse Generator Implant Date: 20120918
Lead Channel Impedance Value: 476 Ohm
Lead Channel Impedance Value: 582 Ohm
Lead Channel Pacing Threshold Amplitude: 0.875 V
Lead Channel Pacing Threshold Pulse Width: 0.4 ms
Lead Channel Setting Pacing Amplitude: 2 V
Lead Channel Setting Pacing Amplitude: 2.5 V
Lead Channel Setting Pacing Pulse Width: 0.4 ms
Lead Channel Setting Sensing Sensitivity: 2 mV

## 2018-05-15 NOTE — Telephone Encounter (Signed)
LMOVM reminding pt to send remote transmission.   

## 2018-05-16 NOTE — Progress Notes (Signed)
Remote pacemaker transmission.   

## 2018-08-09 ENCOUNTER — Other Ambulatory Visit: Payer: Self-pay | Admitting: Gastroenterology

## 2018-08-09 DIAGNOSIS — C189 Malignant neoplasm of colon, unspecified: Secondary | ICD-10-CM

## 2018-08-09 DIAGNOSIS — Z1211 Encounter for screening for malignant neoplasm of colon: Secondary | ICD-10-CM

## 2018-08-14 ENCOUNTER — Other Ambulatory Visit: Payer: 59

## 2018-08-15 ENCOUNTER — Telehealth: Payer: Self-pay | Admitting: Cardiology

## 2018-08-15 ENCOUNTER — Encounter: Payer: 59 | Admitting: *Deleted

## 2018-08-15 NOTE — Telephone Encounter (Signed)
LMOVM reminding pt to send remote transmission.   

## 2018-08-16 ENCOUNTER — Ambulatory Visit
Admission: RE | Admit: 2018-08-16 | Discharge: 2018-08-16 | Disposition: A | Discharge status: Home / Self Care | Payer: 59 | Source: Ambulatory Visit | Attending: Gastroenterology | Admitting: Gastroenterology

## 2018-08-16 DIAGNOSIS — Z1211 Encounter for screening for malignant neoplasm of colon: Secondary | ICD-10-CM

## 2018-08-16 DIAGNOSIS — C189 Malignant neoplasm of colon, unspecified: Secondary | ICD-10-CM

## 2018-08-16 MED ORDER — IOPAMIDOL (ISOVUE-300) INJECTION 61%
100.0000 mL | Freq: Once | INTRAVENOUS | Status: AC | PRN
  Administered 2018-08-16: 100 mL via INTRAVENOUS

## 2018-08-21 ENCOUNTER — Ambulatory Visit (INDEPENDENT_AMBULATORY_CARE_PROVIDER_SITE_OTHER): Payer: 59 | Admitting: *Deleted

## 2018-08-21 DIAGNOSIS — R001 Bradycardia, unspecified: Secondary | ICD-10-CM

## 2018-08-22 ENCOUNTER — Other Ambulatory Visit: Payer: Self-pay | Admitting: Surgery

## 2018-08-22 ENCOUNTER — Telehealth: Payer: Self-pay

## 2018-08-22 NOTE — Telephone Encounter (Signed)
   [facility] Pre-operative Risk Assessment    Request for surgical clearance:  1. What type of surgery is being performed? Laparoscopic Assisted Partial Colectomy   2. When is this surgery scheduled? TBD  3. What type of clearance is required (medical clearance vs. Pharmacy clearance to hold med vs. Both)? Medical   4. Are there any medications that need to be held prior to surgery and how long?  5. Practice name and name of physician performing surgery? Dr. [facility]    6. What is your office phone number [phone]    7.   What is your office fax number [phone]  8.   Anesthesia type (None, local, MAC, general) ?             General

## 2018-08-22 NOTE — Progress Notes (Signed)
Remote pacemaker transmission.   

## 2018-08-24 ENCOUNTER — Encounter: Payer: Self-pay | Admitting: Cardiology

## 2018-08-24 NOTE — Telephone Encounter (Signed)
Follow Up:     Pt's wife called to check on the status of his clearance.

## 2018-08-24 NOTE — Telephone Encounter (Signed)
Follow up    Nurse calling from [facility] wanted to call to advise that Dr. Rush Farmer is wanting patient to have surgery some time next week. Not sure of the date.

## 2018-08-25 NOTE — Telephone Encounter (Signed)
   Primary Cardiologist: Thompson Grayer, MD  Chart reviewed as part of pre-operative protocol coverage. Patient was contacted 08/25/2018 in reference to pre-operative risk assessment for pending surgery as outlined below.  Tracy [name] was last seen on 11/30/17 by Dr, Rayann Heman.  Since that day, Tracy [name] has done well w/o any cardiac symptoms. No exertional CP or dyspnea. He is able to ambulate a flight of stairs w/o CP > 4 METs of physical activity.   Therefore, based on ACC/AHA guidelines, the patient would be at acceptable risk for the planned procedure without further cardiovascular testing.   I will route this recommendation to the requesting party via Epic fax function and remove from pre-op pool.  Please call with questions.  Lyda Jester, PA-C 08/25/2018, 4:55 PM

## 2018-08-25 NOTE — Telephone Encounter (Signed)
Follow up   Pt's wife is calling to check on the status of the pt's surgical clearance. States it is urgent and the Dr is wanting to do the surgery next week. Please call

## 2018-08-25 NOTE — Telephone Encounter (Signed)
Follow Up:     Please call today, wife wants to know the status of pt's clearance.

## 2018-08-31 ENCOUNTER — Encounter (HOSPITAL_COMMUNITY): Payer: Self-pay | Admitting: *Deleted

## 2018-08-31 ENCOUNTER — Other Ambulatory Visit: Payer: Self-pay

## 2018-08-31 MED ORDER — CEFOTETAN DISODIUM-DEXTROSE 2-2.08 GM-%(50ML) IV SOLR
2.0000 g | INTRAVENOUS | Status: AC
  Administered 2018-09-01: 2 g via INTRAVENOUS
  Filled 2018-08-31: qty 50

## 2018-08-31 NOTE — Progress Notes (Signed)
Spoke with pt and wife for pre-op call. Pt has hx of node dysfunction and has a pacemaker ([employer]tr[employer]). Pt denies any other cardiac history. Pt's cardiologist is Dr. Thompson Grayer, last office visit was 11/30/17. Cardiac clearance in Epic dated 08/25/18.  Pt states he is not diabetic.  Pt states he has started his colon prep. His wife states that they do not have the Ensure drinks. I told her that she could pick them up here and she states she will come by later this AM. Pt instructed to drink 2 of the drinks this evening and the 3rd one in the AM by 4:30 AM. Encouraged pt to drink plenty of clear liquids today while doing the bowel prep.

## 2018-08-31 NOTE — H&P (Signed)
Tracy [name] Documented: 08/22/2018 9:35 AM Location: [facility] Patient #: [id number] DOB: Sep 17, 1956 Undefined / Language: Tracy [name] / Race: White Male   History of Present Illness [name] A. Ninfa Linden MD; 08/22/2018 9:52 AM) The patient is a 62 year old male who presents with a colonic mass. This patient is referred by Dr. Paulita Fujita for evaluation of a near obstructing mass in his cecum. This was found on recent screening colonoscopy. He has a history of chronic diarrhea. He also has a history of sick sinus syndrome and has a pacemaker in place. He currently has no nausea or vomiting. I do not have the final pathology results although I was told by his gastroenterologist and he was told also that this is a malignancy. He has had a preoperative CAT scan of his chest, abdomen, and pelvis which shows the almost 6 cm mass in the cecum near the ileocecal valve. There were no enlarged lymph nodes and no other signs of metastatic disease. He currently denies chest pain or shortness of breath. He does have sleep apnea and is on CPAP   Past Surgical History Geni Bers [place], RMA; 08/22/2018 9:35 AM) Colon Polyp Removal - Colonoscopy  Open Inguinal Hernia Surgery  Left. Oral Surgery  Vasectomy   Diagnostic Studies History Geni Bers [place], RMA; 08/22/2018 9:35 AM) Colonoscopy  within last year  Allergies Geni Bers [place], RMA; 08/22/2018 9:35 AM) No Known Drug Allergies [08/22/2018]: Allergies Reconciled   Medication History [employer], RMA; 08/22/2018 9:36 AM) Atorvastatin Calcium (10MG  Tablet, Oral) Active. Metoprolol Succinate ER (50MG  Tablet ER 24HR, Oral) Active. PARoxetine HCl (20MG  Tablet, Oral) Active. GaviLyte-N with Flavor Pack (420GM For Solution, Oral) Active. Multi Vitamin (Oral) Active. Calcium (Oral) Specific strength unknown - Active. Imodium (Oral) Specific strength unknown - Active. Medications Reconciled  Social History  Geni Bers [place], RMA; 08/22/2018 9:35 AM) Alcohol use  Remotely quit alcohol use. No caffeine use  No drug use  Tobacco use  Current some day smoker.  Family History Geni Bers [place], RMA; 08/22/2018 9:35 AM) Heart Disease  Father, Mother. Heart disease in male family member before age 29   Other Problems Marguarite Arbour, RMA; 08/22/2018 9:35 AM) Back Pain  Colon Cancer  Inguinal Hernia  Other disease, cancer, significant illness  Sleep Apnea     Review of Systems Geni Bers [place] RMA; 08/22/2018 9:35 AM) General Not Present- Appetite Loss, Chills, Fatigue, Fever, Night Sweats, Weight Gain and Weight Loss. Skin Present- Dryness. Not Present- Change in Wart/Mole, Hives, Jaundice, New Lesions, Non-Healing Wounds, Rash and Ulcer. HEENT Present- Wears glasses/contact lenses. Not Present- Earache, Hearing Loss, Hoarseness, Nose Bleed, Oral Ulcers, Ringing in the Ears, Seasonal Allergies, Sinus Pain, Sore Throat, Visual Disturbances and Yellow Eyes. Respiratory Present- Snoring. Not Present- Bloody sputum, Chronic Cough, Difficulty Breathing and Wheezing. Breast Not Present- Breast Mass, Breast Pain, Nipple Discharge and Skin Changes. Cardiovascular Not Present- Chest Pain, Difficulty Breathing Lying Down, Leg Cramps, Palpitations, Rapid Heart Rate, Shortness of Breath and Swelling of Extremities. Gastrointestinal Present- Chronic diarrhea and Excessive gas. Not Present- Abdominal Pain, Bloating, Bloody Stool, Change in Bowel Habits, Constipation, Difficulty Swallowing, Gets full quickly at meals, Hemorrhoids, Indigestion, Nausea, Rectal Pain and Vomiting. Male Genitourinary Not Present- Blood in Urine, Change in Urinary Stream, Frequency, Impotence, Nocturia, Painful Urination, Urgency and Urine Leakage. Musculoskeletal Not Present- Back Pain, Joint Pain, Joint Stiffness, Muscle Pain, Muscle Weakness and Swelling of Extremities. Neurological Not Present- Decreased  Memory, Fainting, Headaches, Numbness, Seizures, Tingling, Tremor, Trouble walking and Weakness. Psychiatric Not Present- Anxiety, Bipolar,  Change in Sleep Pattern, Depression, Fearful and Frequent crying. Endocrine Not Present- Cold Intolerance, Excessive Hunger, Hair Changes, Heat Intolerance, Hot flashes and New Diabetes. Hematology Not Present- Blood Thinners, Easy Bruising, Excessive bleeding, Gland problems, HIV and Persistent Infections.  Vitals Geni Bers [place] RMA; 08/22/2018 9:37 AM) 08/22/2018 9:36 AM Weight: 181.8 lb Height: 72in Body Surface Area: 2.05 m Body Mass Index: 24.66 kg/m  Pain Level: 0/10 Temp.: 97.64F(Temporal)  Pulse: 62 (Regular)  P.OX: 100% (Room air) BP: 130/80 (Sitting, Left Arm, Standard)       Physical Exam (Myrl Bynum A. Ninfa Linden MD; 08/22/2018 9:53 AM) General Mental Status-Alert. General Appearance-Consistent with stated age. Hydration-Well hydrated. Voice-Normal.  Head and Neck Head-normocephalic, atraumatic with no lesions or palpable masses. Trachea-midline. Thyroid Gland Characteristics - normal size and consistency.  Eye Eyeball - Bilateral-Extraocular movements intact. Sclera/Conjunctiva - Bilateral-No scleral icterus.  Chest and Lung Exam Chest and lung exam reveals -quiet, even and easy respiratory effort with no use of accessory muscles and on auscultation, normal breath sounds, no adventitious sounds and normal vocal resonance. Inspection Chest Wall - Normal. Back - normal.  Breast - Did not examine.  Cardiovascular Cardiovascular examination reveals -normal heart sounds, regular rate and rhythm with no murmurs and normal pedal pulses bilaterally.  Abdomen Inspection Inspection of the abdomen reveals - No Hernias. Skin - Scar - no surgical scars. Palpation/Percussion Palpation and Percussion of the abdomen reveal - Soft, Non Tender, No Rebound tenderness, No Rigidity (guarding) and No  hepatosplenomegaly. Auscultation Auscultation of the abdomen reveals - Bowel sounds normal. Note: His abdomen is soft and nontender and there are no palpable masses   Neurologic - Did not examine.  Musculoskeletal - Did not examine.  Lymphatic Head & Neck  General Head & Neck Lymphatics: Bilateral - Description - Normal. Axillary  General Axillary Region: Bilateral - Description - Normal. Tenderness - Non Tender. Femoral & Inguinal - Did not examine.    Assessment & Plan (Mihaela Fajardo A. Ninfa Linden MD; 08/22/2018 9:54 AM) MASS OF COLON (K63.89) Impression: This is a patient with a large mass of the cecum which is near obstructing. A laparoscopic assisted partial colectomy is recommended urgently. I discussed this with the patient and his wife and they're well aware of the need for surgery. He will need cardiac clearance urgently as I planned to do the surgery next week. We discussed the surgical procedure in detail and I gave him literature regarding surgery. We discussed the risk which includes but is not limited to bleeding, infection, the need to convert to an open procedure, injury to surrounding structures, anastomotic leak, cardiopulmonary issues, the need for further procedures, postoperative recovery, etc. They understand and wish to proceed with surgery.

## 2018-09-01 ENCOUNTER — Inpatient Hospital Stay (HOSPITAL_COMMUNITY): Payer: 59 | Admitting: Certified Registered"

## 2018-09-01 ENCOUNTER — Other Ambulatory Visit: Payer: Self-pay

## 2018-09-01 ENCOUNTER — Inpatient Hospital Stay (HOSPITAL_COMMUNITY): Payer: 59

## 2018-09-01 ENCOUNTER — Inpatient Hospital Stay (HOSPITAL_COMMUNITY)
Admission: RE | Admit: 2018-09-01 | Discharge: 2018-09-04 | DRG: 331 | Disposition: A | Discharge status: Home / Self Care | Payer: 59 | Attending: Surgery | Admitting: Surgery

## 2018-09-01 ENCOUNTER — Encounter (HOSPITAL_COMMUNITY)
Admission: RE | Disposition: A | Discharge status: Home / Self Care | Payer: Self-pay | Source: Home / Self Care | Attending: Surgery

## 2018-09-01 ENCOUNTER — Encounter (HOSPITAL_COMMUNITY): Payer: Self-pay | Admitting: *Deleted

## 2018-09-01 DIAGNOSIS — C189 Malignant neoplasm of colon, unspecified: Principal | ICD-10-CM | POA: Diagnosis present

## 2018-09-01 DIAGNOSIS — F17210 Nicotine dependence, cigarettes, uncomplicated: Secondary | ICD-10-CM | POA: Diagnosis present

## 2018-09-01 DIAGNOSIS — Z23 Encounter for immunization: Secondary | ICD-10-CM

## 2018-09-01 DIAGNOSIS — Z950 Presence of cardiac pacemaker: Secondary | ICD-10-CM | POA: Diagnosis not present

## 2018-09-01 DIAGNOSIS — Z79899 Other long term (current) drug therapy: Secondary | ICD-10-CM

## 2018-09-01 DIAGNOSIS — K4090 Unilateral inguinal hernia, without obstruction or gangrene, not specified as recurrent: Secondary | ICD-10-CM | POA: Diagnosis present

## 2018-09-01 DIAGNOSIS — Z8249 Family history of ischemic heart disease and other diseases of the circulatory system: Secondary | ICD-10-CM | POA: Diagnosis not present

## 2018-09-01 DIAGNOSIS — D500 Iron deficiency anemia secondary to blood loss (chronic): Secondary | ICD-10-CM | POA: Diagnosis present

## 2018-09-01 DIAGNOSIS — I495 Sick sinus syndrome: Secondary | ICD-10-CM | POA: Diagnosis present

## 2018-09-01 DIAGNOSIS — G4730 Sleep apnea, unspecified: Secondary | ICD-10-CM | POA: Diagnosis present

## 2018-09-01 DIAGNOSIS — R599 Enlarged lymph nodes, unspecified: Secondary | ICD-10-CM | POA: Diagnosis present

## 2018-09-01 DIAGNOSIS — Z419 Encounter for procedure for purposes other than remedying health state, unspecified: Secondary | ICD-10-CM

## 2018-09-01 HISTORY — PX: PARTIAL COLECTOMY: SHX5273

## 2018-09-01 HISTORY — DX: Malignant (primary) neoplasm, unspecified: C80.1

## 2018-09-01 HISTORY — DX: Presence of cardiac pacemaker: Z95.0

## 2018-09-01 HISTORY — PX: LAPAROSCOPIC PARTIAL COLECTOMY: SHX5907

## 2018-09-01 LAB — POCT I-STAT 4, (NA,K, GLUC, HGB,HCT)
Glucose, Bld: 119 mg/dL — ABNORMAL HIGH (ref 70–99)
HCT: 24 % — ABNORMAL LOW (ref 39.0–52.0)
Hemoglobin: 8.2 g/dL — ABNORMAL LOW (ref 13.0–17.0)
Potassium: 3.6 mmol/L (ref 3.5–5.1)
Sodium: 140 mmol/L (ref 135–145)

## 2018-09-01 LAB — CBC
HCT: 22.4 % — ABNORMAL LOW (ref 39.0–52.0)
HCT: 21.4 % — ABNORMAL LOW (ref 39.0–52.0)
Hemoglobin: 5.7 g/dL — CL (ref 13.0–17.0)
Hemoglobin: 5.7 g/dL — CL (ref 13.0–17.0)
MCH: 15.6 pg — ABNORMAL LOW (ref 26.0–34.0)
MCH: 16 pg — ABNORMAL LOW (ref 26.0–34.0)
MCHC: 25.4 g/dL — ABNORMAL LOW (ref 30.0–36.0)
MCHC: 26.6 g/dL — ABNORMAL LOW (ref 30.0–36.0)
MCV: 61.2 fL — ABNORMAL LOW (ref 80.0–100.0)
MCV: 60.1 fL — ABNORMAL LOW (ref 80.0–100.0)
Platelets: 401 10*3/uL — ABNORMAL HIGH (ref 150–400)
Platelets: 412 10*3/uL — ABNORMAL HIGH (ref 150–400)
RBC: 3.66 MIL/uL — ABNORMAL LOW (ref 4.22–5.81)
RBC: 3.56 MIL/uL — ABNORMAL LOW (ref 4.22–5.81)
RDW: 20.6 % — ABNORMAL HIGH (ref 11.5–15.5)
RDW: 20.3 % — ABNORMAL HIGH (ref 11.5–15.5)
WBC: 5.2 10*3/uL (ref 4.0–10.5)
WBC: 12.6 10*3/uL — ABNORMAL HIGH (ref 4.0–10.5)
nRBC: 0 % (ref 0.0–0.2)
nRBC: 0 % (ref 0.0–0.2)

## 2018-09-01 LAB — BASIC METABOLIC PANEL
Anion gap: 12 (ref 5–15)
BUN: 10 mg/dL (ref 8–23)
CO2: 23 mmol/L (ref 22–32)
Calcium: 8.9 mg/dL (ref 8.9–10.3)
Chloride: 104 mmol/L (ref 98–111)
Creatinine, Ser: 0.98 mg/dL (ref 0.61–1.24)
GFR calc Af Amer: >60 mL/min (ref >60)
GFR calc non Af Amer: >60 mL/min (ref >60)
Glucose, Bld: 186 mg/dL — ABNORMAL HIGH (ref 70–99)
Potassium: 3.3 mmol/L — ABNORMAL LOW (ref 3.5–5.1)
Sodium: 139 mmol/L (ref 135–145)

## 2018-09-01 LAB — HEMOGLOBIN A1C
Hgb A1c MFr Bld: 5.8 % — ABNORMAL HIGH (ref 4.8–5.6)
Mean Plasma Glucose: 119.76 mg/dL

## 2018-09-01 LAB — PREPARE RBC (CROSSMATCH)

## 2018-09-01 LAB — ABO/RH: ABO/RH(D): O NEG

## 2018-09-01 SURGERY — LAPAROSCOPIC PARTIAL COLECTOMY
Anesthesia: General | Site: Abdomen

## 2018-09-01 MED ORDER — LIDOCAINE 2% (20 MG/ML) 5 ML SYRINGE
INTRAMUSCULAR | Status: DC | PRN
  Administered 2018-09-01: 60 mg via INTRAVENOUS

## 2018-09-01 MED ORDER — ONDANSETRON HCL 4 MG/2ML IJ SOLN
INTRAMUSCULAR | Status: AC
  Filled 2018-09-01: qty 2

## 2018-09-01 MED ORDER — MIDAZOLAM HCL 2 MG/2ML IJ SOLN
INTRAMUSCULAR | Status: AC
  Filled 2018-09-01: qty 2

## 2018-09-01 MED ORDER — OXYCODONE HCL 5 MG/5ML PO SOLN: 5.0000 mg | Freq: Once | ORAL | Status: DC | PRN

## 2018-09-01 MED ORDER — PNEUMOCOCCAL VAC POLYVALENT 25 MCG/0.5ML IJ INJ
0.5000 mL | INJECTION | INTRAMUSCULAR | Status: AC
  Administered 2018-09-02: 0.5 mL via INTRAMUSCULAR
  Filled 2018-09-01: qty 0.5

## 2018-09-01 MED ORDER — SUGAMMADEX SODIUM 200 MG/2ML IV SOLN
INTRAVENOUS | Status: DC | PRN
  Administered 2018-09-01: 200 mg via INTRAVENOUS

## 2018-09-01 MED ORDER — DIPHENHYDRAMINE HCL 50 MG/ML IJ SOLN: 12.5000 mg | Freq: Four times a day (QID) | INTRAMUSCULAR | Status: DC | PRN

## 2018-09-01 MED ORDER — SODIUM CHLORIDE 0.9% IV SOLUTION: Freq: Once | INTRAVENOUS | Status: DC

## 2018-09-01 MED ORDER — ROCURONIUM BROMIDE 50 MG/5ML IV SOSY
PREFILLED_SYRINGE | INTRAVENOUS | Status: DC | PRN
  Administered 2018-09-01: 20 mg via INTRAVENOUS
  Administered 2018-09-01: 50 mg via INTRAVENOUS

## 2018-09-01 MED ORDER — ACETAMINOPHEN 500 MG PO TABS
1000.0000 mg | ORAL_TABLET | ORAL | Status: AC
  Administered 2018-09-01: 1000 mg via ORAL
  Filled 2018-09-01: qty 2

## 2018-09-01 MED ORDER — FENTANYL CITRATE (PF) 250 MCG/5ML IJ SOLN
INTRAMUSCULAR | Status: AC
  Filled 2018-09-01: qty 5

## 2018-09-01 MED ORDER — BUPIVACAINE-EPINEPHRINE 0.25% -1:200000 IJ SOLN
INTRAMUSCULAR | Status: DC | PRN
  Administered 2018-09-01: .1 mL
  Administered 2018-09-01: 20 mL

## 2018-09-01 MED ORDER — OXYCODONE HCL 5 MG PO TABS: 5.0000 mg | ORAL_TABLET | Freq: Once | ORAL | Status: DC | PRN

## 2018-09-01 MED ORDER — MIDAZOLAM HCL 5 MG/5ML IJ SOLN
INTRAMUSCULAR | Status: DC | PRN
  Administered 2018-09-01: 2 mg via INTRAVENOUS

## 2018-09-01 MED ORDER — ALBUMIN HUMAN 5 % IV SOLN
INTRAVENOUS | Status: DC | PRN
  Administered 2018-09-01 (×2): via INTRAVENOUS

## 2018-09-01 MED ORDER — ENSURE SURGERY PO LIQD
237.0000 mL | Freq: Two times a day (BID) | ORAL | Status: DC
  Administered 2018-09-02 – 2018-09-03 (×4): 237 mL via ORAL
  Filled 2018-09-01 (×7): qty 237

## 2018-09-01 MED ORDER — FENTANYL CITRATE (PF) 100 MCG/2ML IJ SOLN
INTRAMUSCULAR | Status: AC
  Filled 2018-09-01: qty 2

## 2018-09-01 MED ORDER — PHENYLEPHRINE 40 MCG/ML (10ML) SYRINGE FOR IV PUSH (FOR BLOOD PRESSURE SUPPORT)
PREFILLED_SYRINGE | INTRAVENOUS | Status: AC
  Filled 2018-09-01: qty 10

## 2018-09-01 MED ORDER — ONDANSETRON HCL 4 MG PO TABS: 4.0000 mg | ORAL_TABLET | Freq: Four times a day (QID) | ORAL | Status: DC | PRN

## 2018-09-01 MED ORDER — PAROXETINE HCL 20 MG PO TABS
20.0000 mg | ORAL_TABLET | Freq: Every day | ORAL | Status: DC
  Administered 2018-09-02 – 2018-09-03 (×2): 20 mg via ORAL
  Filled 2018-09-01 (×3): qty 1

## 2018-09-01 MED ORDER — KETOROLAC TROMETHAMINE 15 MG/ML IJ SOLN
15.0000 mg | INTRAMUSCULAR | Status: AC
  Administered 2018-09-01: 15 mg via INTRAVENOUS
  Filled 2018-09-01: qty 1

## 2018-09-01 MED ORDER — SODIUM CHLORIDE 0.9 % IR SOLN
Status: DC | PRN
  Administered 2018-09-01: 1000 mL

## 2018-09-01 MED ORDER — LIDOCAINE 2% (20 MG/ML) 5 ML SYRINGE
INTRAMUSCULAR | Status: AC
  Filled 2018-09-01: qty 5

## 2018-09-01 MED ORDER — 0.9 % SODIUM CHLORIDE (POUR BTL) OPTIME
TOPICAL | Status: DC | PRN
  Administered 2018-09-01 (×3): 1000 mL

## 2018-09-01 MED ORDER — DEXAMETHASONE SODIUM PHOSPHATE 10 MG/ML IJ SOLN
INTRAMUSCULAR | Status: DC | PRN
  Administered 2018-09-01: 8 mg via INTRAVENOUS

## 2018-09-01 MED ORDER — FENTANYL CITRATE (PF) 100 MCG/2ML IJ SOLN
25.0000 ug | INTRAMUSCULAR | Status: DC | PRN
  Administered 2018-09-01: 50 ug via INTRAVENOUS

## 2018-09-01 MED ORDER — PROPOFOL 10 MG/ML IV BOLUS
INTRAVENOUS | Status: AC
  Filled 2018-09-01: qty 20

## 2018-09-01 MED ORDER — ROCURONIUM BROMIDE 50 MG/5ML IV SOSY
PREFILLED_SYRINGE | INTRAVENOUS | Status: AC
  Filled 2018-09-01: qty 5

## 2018-09-01 MED ORDER — ACETAMINOPHEN 160 MG/5ML PO SOLN: 1000.0000 mg | Freq: Once | ORAL | Status: DC | PRN

## 2018-09-01 MED ORDER — ALVIMOPAN 12 MG PO CAPS
12.0000 mg | ORAL_CAPSULE | Freq: Two times a day (BID) | ORAL | Status: DC
  Administered 2018-09-02 (×2): 12 mg via ORAL
  Filled 2018-09-01 (×3): qty 1

## 2018-09-01 MED ORDER — GABAPENTIN 300 MG PO CAPS
300.0000 mg | ORAL_CAPSULE | ORAL | Status: AC
  Administered 2018-09-01: 300 mg via ORAL
  Filled 2018-09-01: qty 1

## 2018-09-01 MED ORDER — INFLUENZA VAC SPLIT QUAD 0.5 ML IM SUSY
0.5000 mL | PREFILLED_SYRINGE | INTRAMUSCULAR | Status: AC
  Administered 2018-09-02: 0.5 mL via INTRAMUSCULAR
  Filled 2018-09-01: qty 0.5

## 2018-09-01 MED ORDER — LACTATED RINGERS IV SOLN
INTRAVENOUS | Status: DC | PRN
  Administered 2018-09-01 (×2): via INTRAVENOUS

## 2018-09-01 MED ORDER — CHLORHEXIDINE GLUCONATE CLOTH 2 % EX PADS: 6.0000 | MEDICATED_PAD | Freq: Once | CUTANEOUS | Status: DC

## 2018-09-01 MED ORDER — SODIUM CHLORIDE 0.9% IV SOLUTION
Freq: Once | INTRAVENOUS | Status: AC
  Administered 2018-09-01: 16:00:00 via INTRAVENOUS

## 2018-09-01 MED ORDER — PHENYLEPHRINE 40 MCG/ML (10ML) SYRINGE FOR IV PUSH (FOR BLOOD PRESSURE SUPPORT)
PREFILLED_SYRINGE | INTRAVENOUS | Status: DC | PRN
  Administered 2018-09-01: 80 ug via INTRAVENOUS
  Administered 2018-09-01: 120 ug via INTRAVENOUS

## 2018-09-01 MED ORDER — ONDANSETRON HCL 4 MG/2ML IJ SOLN: 4.0000 mg | Freq: Four times a day (QID) | INTRAMUSCULAR | Status: DC | PRN

## 2018-09-01 MED ORDER — ACETAMINOPHEN 500 MG PO TABS: 1000.0000 mg | ORAL_TABLET | Freq: Once | ORAL | Status: DC | PRN

## 2018-09-01 MED ORDER — GABAPENTIN 300 MG PO CAPS
300.0000 mg | ORAL_CAPSULE | Freq: Two times a day (BID) | ORAL | Status: DC
  Administered 2018-09-01 – 2018-09-03 (×5): 300 mg via ORAL
  Filled 2018-09-01 (×6): qty 1

## 2018-09-01 MED ORDER — ACETAMINOPHEN 10 MG/ML IV SOLN: 1000.0000 mg | Freq: Once | INTRAVENOUS | Status: DC | PRN

## 2018-09-01 MED ORDER — TRAMADOL HCL 50 MG PO TABS
50.0000 mg | ORAL_TABLET | Freq: Four times a day (QID) | ORAL | Status: DC | PRN
  Administered 2018-09-03: 50 mg via ORAL
  Filled 2018-09-01: qty 1

## 2018-09-01 MED ORDER — EPHEDRINE 5 MG/ML INJ
INTRAVENOUS | Status: AC
  Filled 2018-09-01: qty 10

## 2018-09-01 MED ORDER — ONDANSETRON HCL 4 MG/2ML IJ SOLN
INTRAMUSCULAR | Status: DC | PRN
  Administered 2018-09-01: 4 mg via INTRAVENOUS

## 2018-09-01 MED ORDER — BUPIVACAINE-EPINEPHRINE (PF) 0.25% -1:200000 IJ SOLN
INTRAMUSCULAR | Status: AC
  Filled 2018-09-01: qty 60

## 2018-09-01 MED ORDER — POTASSIUM CHLORIDE IN NACL 20-0.9 MEQ/L-% IV SOLN
INTRAVENOUS | Status: DC
  Administered 2018-09-01 – 2018-09-02 (×3): via INTRAVENOUS
  Filled 2018-09-01 (×2): qty 1000

## 2018-09-01 MED ORDER — PROPOFOL 10 MG/ML IV BOLUS
INTRAVENOUS | Status: DC | PRN
  Administered 2018-09-01: 110 mg via INTRAVENOUS

## 2018-09-01 MED ORDER — DEXAMETHASONE SODIUM PHOSPHATE 10 MG/ML IJ SOLN
INTRAMUSCULAR | Status: AC
  Filled 2018-09-01: qty 1

## 2018-09-01 MED ORDER — DIPHENHYDRAMINE HCL 12.5 MG/5ML PO ELIX: 12.5000 mg | ORAL_SOLUTION | Freq: Four times a day (QID) | ORAL | Status: DC | PRN

## 2018-09-01 MED ORDER — ENOXAPARIN SODIUM 40 MG/0.4ML ~~LOC~~ SOLN
40.0000 mg | SUBCUTANEOUS | Status: DC
  Administered 2018-09-02 – 2018-09-03 (×2): 40 mg via SUBCUTANEOUS
  Filled 2018-09-01 (×2): qty 0.4

## 2018-09-01 MED ORDER — MORPHINE SULFATE (PF) 2 MG/ML IV SOLN: 1.0000 mg | INTRAVENOUS | Status: DC | PRN

## 2018-09-01 MED ORDER — FENTANYL CITRATE (PF) 100 MCG/2ML IJ SOLN
INTRAMUSCULAR | Status: DC | PRN
  Administered 2018-09-01 (×2): 50 ug via INTRAVENOUS
  Administered 2018-09-01: 100 ug via INTRAVENOUS
  Administered 2018-09-01 (×2): 50 ug via INTRAVENOUS
  Administered 2018-09-01: 100 ug via INTRAVENOUS

## 2018-09-01 MED ORDER — OXYCODONE HCL 5 MG PO TABS
5.0000 mg | ORAL_TABLET | ORAL | Status: DC | PRN
  Administered 2018-09-01 – 2018-09-03 (×5): 10 mg via ORAL
  Filled 2018-09-01 (×5): qty 2

## 2018-09-01 MED ORDER — STERILE WATER FOR IRRIGATION IR SOLN
Status: DC | PRN
  Administered 2018-09-01: 1000 mL

## 2018-09-01 MED ORDER — ALVIMOPAN 12 MG PO CAPS
12.0000 mg | ORAL_CAPSULE | ORAL | Status: AC
  Administered 2018-09-01: 12 mg via ORAL
  Filled 2018-09-01: qty 1

## 2018-09-01 MED ORDER — METOPROLOL SUCCINATE ER 50 MG PO TB24
50.0000 mg | ORAL_TABLET | Freq: Every day | ORAL | Status: DC
  Administered 2018-09-02 – 2018-09-03 (×2): 50 mg via ORAL
  Filled 2018-09-01 (×3): qty 1

## 2018-09-01 MED ORDER — CELECOXIB 200 MG PO CAPS
200.0000 mg | ORAL_CAPSULE | Freq: Two times a day (BID) | ORAL | Status: DC
  Administered 2018-09-01 – 2018-09-03 (×6): 200 mg via ORAL
  Filled 2018-09-01 (×7): qty 1

## 2018-09-01 SURGICAL SUPPLY — 64 items
ADH SKN CLS LQ APL DERMABOND (GAUZE/BANDAGES/DRESSINGS) ×1
BLADE CLIPPER SURG (BLADE) ×1 IMPLANT
BLADE SURG 10 STRL SS (BLADE) ×2 IMPLANT
CANISTER SUCT 3000ML PPV (MISCELLANEOUS) ×2 IMPLANT
CELLS DAT CNTRL 66122 CELL SVR (MISCELLANEOUS) ×1 IMPLANT
COVER SURGICAL LIGHT HANDLE (MISCELLANEOUS) ×4 IMPLANT
COVER WAND RF STERILE (DRAPES) ×1 IMPLANT
DERMABOND ADHESIVE PROPEN (GAUZE/BANDAGES/DRESSINGS) ×1
DERMABOND ADVANCED .7 DNX6 (GAUZE/BANDAGES/DRESSINGS) IMPLANT
DRAPE HALF SHEET 40X57 (DRAPES) IMPLANT
DRAPE WARM FLUID 44X44 (DRAPE) ×2 IMPLANT
ELECT BLADE 6.5 EXT (BLADE) ×2 IMPLANT
ELECT CAUTERY BLADE 6.4 (BLADE) ×3 IMPLANT
ELECT REM PT RETURN 9FT ADLT (ELECTROSURGICAL) ×2
ELECTRODE REM PT RTRN 9FT ADLT (ELECTROSURGICAL) ×1 IMPLANT
GAUZE 4X4 16PLY RFD (DISPOSABLE) ×1 IMPLANT
GEL ULTRASOUND 20GR AQUASONIC (MISCELLANEOUS) IMPLANT
GLOVE SURG SIGNA 7.5 PF LTX (GLOVE) ×4 IMPLANT
GOWN STRL REUS W/ TWL LRG LVL3 (GOWN DISPOSABLE) ×6 IMPLANT
GOWN STRL REUS W/ TWL XL LVL3 (GOWN DISPOSABLE) ×2 IMPLANT
GOWN STRL REUS W/TWL LRG LVL3 (GOWN DISPOSABLE) ×10
GOWN STRL REUS W/TWL XL LVL3 (GOWN DISPOSABLE) ×4
LIGASURE IMPACT 36 18CM CVD LR (INSTRUMENTS) ×1 IMPLANT
NS IRRIG 1000ML POUR BTL (IV SOLUTION) ×4 IMPLANT
PACK COLON (CUSTOM PROCEDURE TRAY) ×1 IMPLANT
PAD ARMBOARD 7.5X6 YLW CONV (MISCELLANEOUS) ×4 IMPLANT
PENCIL BUTTON HOLSTER BLD 10FT (ELECTRODE) ×3 IMPLANT
RELOAD PROXIMATE 75MM BLUE (ENDOMECHANICALS) ×4 IMPLANT
RELOAD STAPLE 75 3.8 BLU REG (ENDOMECHANICALS) IMPLANT
RETRACTOR WND ALEXIS 18 MED (MISCELLANEOUS) IMPLANT
RTRCTR WOUND ALEXIS 18CM MED (MISCELLANEOUS) ×2
SCISSORS LAP 5X35 DISP (ENDOMECHANICALS) ×2 IMPLANT
SET IRRIG TUBING LAPAROSCOPIC (IRRIGATION / IRRIGATOR) ×1 IMPLANT
SHEARS HARMONIC ACE PLUS 36CM (ENDOMECHANICALS) ×2 IMPLANT
SLEEVE ENDOPATH XCEL 5M (ENDOMECHANICALS) ×2 IMPLANT
SPECIMEN JAR LARGE (MISCELLANEOUS) ×2 IMPLANT
SPONGE LAP 18X18 RF (DISPOSABLE) ×1 IMPLANT
STAPLER GUN LINEAR PROX 60 (STAPLE) ×1 IMPLANT
STAPLER PROXIMATE 75MM BLUE (STAPLE) ×1 IMPLANT
STAPLER VISISTAT 35W (STAPLE) ×2 IMPLANT
SURGILUBE 2OZ TUBE FLIPTOP (MISCELLANEOUS) IMPLANT
SUT MNCRL AB 4-0 PS2 18 (SUTURE) ×1 IMPLANT
SUT PDS AB 1 TP1 96 (SUTURE) ×4 IMPLANT
SUT PROLENE 2 0 CT2 30 (SUTURE) IMPLANT
SUT PROLENE 2 0 KS (SUTURE) IMPLANT
SUT SILK 2 0 SH CR/8 (SUTURE) ×2 IMPLANT
SUT SILK 2 0 TIES 10X30 (SUTURE) ×2 IMPLANT
SUT SILK 3 0 SH CR/8 (SUTURE) ×2 IMPLANT
SUT SILK 3 0 TIES 10X30 (SUTURE) ×2 IMPLANT
SUT VICRYL 0 UR6 27IN ABS (SUTURE) ×1 IMPLANT
SYR BULB IRRIGATION 50ML (SYRINGE) ×2 IMPLANT
SYS LAPSCP GELPORT 120MM (MISCELLANEOUS)
SYSTEM LAPSCP GELPORT 120MM (MISCELLANEOUS) IMPLANT
TOWEL OR 17X26 10 PK STRL BLUE (TOWEL DISPOSABLE) ×4 IMPLANT
TRAY FOLEY MTR SLVR 16FR STAT (SET/KITS/TRAYS/PACK) ×2 IMPLANT
TRAY PROCTOSCOPIC FIBER OPTIC (SET/KITS/TRAYS/PACK) IMPLANT
TROCAR XCEL 12X100 BLDLESS (ENDOMECHANICALS) ×1 IMPLANT
TROCAR XCEL BLUNT TIP 100MML (ENDOMECHANICALS) IMPLANT
TROCAR XCEL NON-BLD 11X100MML (ENDOMECHANICALS) IMPLANT
TROCAR XCEL NON-BLD 5MMX100MML (ENDOMECHANICALS) ×2 IMPLANT
TUBE CONNECTING 12X1/4 (SUCTIONS) ×4 IMPLANT
TUBING INSUF HEATED (TUBING) ×2 IMPLANT
UNDERPAD 30X30 (UNDERPADS AND DIAPERS) ×2 IMPLANT
WATER STERILE IRR 1000ML POUR (IV SOLUTION) ×2 IMPLANT

## 2018-09-01 NOTE — Anesthesia Procedure Notes (Signed)
Procedure Name: Intubation Date/Time: 09/01/2018 7:36 AM Performed by: Orlie Dakin, CRNA Pre-anesthesia Checklist: Patient identified, Emergency Drugs available, Suction available and Patient being monitored Patient Re-evaluated:Patient Re-evaluated prior to induction Oxygen Delivery Method: Circle system utilized Preoxygenation: Pre-oxygenation with 100% oxygen Induction Type: IV induction Ventilation: Mask ventilation without difficulty Laryngoscope Size: Miller and 3 Grade View: Grade II Tube type: Oral Tube size: 7.5 mm Number of attempts: 1 Airway Equipment and Method: Stylet Placement Confirmation: ETT inserted through vocal cords under direct vision,  positive ETCO2 and breath sounds checked- equal and bilateral Secured at: 23 cm Tube secured with: Tape Dental Injury: Teeth and Oropharynx as per pre-operative assessment  Comments: DL, anterior anatomy noted.  4x4s bite block used.

## 2018-09-01 NOTE — Transfer of Care (Signed)
Immediate Anesthesia Transfer of Care Note  Patient: Tracy [name]  Procedure(s) Performed: LAPAROSCOPIC ASSISTED PARTIAL COLECTOMY (N/A Abdomen)  Patient Location: PACU  Anesthesia Type:General  Level of Consciousness: awake, oriented and patient cooperative  Airway & Oxygen Therapy: Patient Spontanous Breathing and Patient connected to face mask oxygen  Post-op Assessment: Report given to RN and Post -op Vital signs reviewed and stable  Post vital signs: Reviewed and stable  Last Vitals:  Vitals Value Taken Time  BP 124/64 09/01/2018  9:46 AM  Temp    Pulse 80 09/01/2018  9:50 AM  Resp 18 09/01/2018  9:50 AM  SpO2 94 % 09/01/2018  9:50 AM  Vitals shown include unvalidated device data.  Last Pain:  Vitals:   09/01/18 0950  PainSc: (P) 0-No pain      Patients Stated Pain Goal: 5 ([id number])  Complications: No apparent anesthesia complications

## 2018-09-01 NOTE — Op Note (Signed)
LAPAROSCOPIC PARTIAL COLECTOMY  Procedure Note  Tracy [name] 09/01/2018   Pre-op Diagnosis: COLON CANCER     Post-op Diagnosis: SAME  Procedure(s): LAPAROSCOPIC ASSISTED PARTIAL ILEOCOLECTOMY  Surgeon(s): Coralie Keens, MD  Anesthesia: General  Staff:  Circulator: Rosanne Sack, RN; [place], Florencia Reasons, RN Scrub Person: Dollene Cleveland T Circulator Assistant: Rozell Searing, RN; [place], [place] C, RN  Estimated Blood Loss: less than 50 mL               Specimens: SENT TO PATH  Indications: This is a 62 year old gentleman recently diagnosed with a large adenocarcinoma of the colon in the cecum which is near obstructing.  The decision was made to proceed to the operating room for laparoscopic-assisted ileocolectomy  Findings: The patient was found to have a very large mass in his cecum near the terminal ileum.  There were slightly enlarged lymph nodes in the mesentery but no other gross evidence of metastatic disease  Procedure: The patient was brought to the operating room and identified as the correct patient.  He was placed supine on the operating room table and general anesthesia was induced.  His abdomen was then prepped and draped in the usual sterile fashion.  I made a small vertical incision just below the umbilicus with a scalpel.  I took this down to the fascia which was then opened the scalpel.  Hemostat was then used to pass to the peritoneal cavity under direct vision.  A 0 Vicryl pursestring suture was then placed around the fascial opening.  The [facility] port was placed the opening and insufflation of the abdomen was begun.  I placed another 5 mm trocar at the midline in the upper abdomen.  This was done under direct vision.  I then identified the cecum which was mostly in the right upper quadrant with a retrocecal appendix and retrocecal terminal ileum.  The patient had a firm mass in the cecum.  I can see the tattoo from the colonoscopy.  At this point I placed  another 5 mm trocar in the patient's upper midline under direct vision.  I then mobilized the right colon along the white line of Toldt and took down the hepatic flexure with the harmonic scalpel.  I then had to mobilize the terminal ileum from the retroperitoneum with the harmonic scalpel as well.  I then dissected omentum off of the side of the colon with the harmonic scalpel and mobilized further toward the transverse colon.  I saw no gross evidence of metastatic disease at this point.  Once the colon and ileum were completely mobilized all the way past the midline I then converted to the open portion of the procedure.  I removed the two 5 mm trochars included an incision with a scalpel connecting the 2 port sites.  I then took this down to the fashion peritoneum with electrocautery.  I did a wound protector into the incision.  I then eviscerated the cecum, terminal ileum, and proximal colon.  Again, this was a hard firm mass with omentum stuck to it.  There were several enlarged lymph nodes in the mesentery.  We transected the small bowel at the distal ileum with a GIA-75 stapler.  We then identified the proximal transverse colon and transected the colon as well with a GIA-75 stapler.  I then took down the mesentery with the LigaSure cautery device and several silk sutures.  The specimen was then sent to pathology for evaluation.  I then reapproximated the small bowel to  the colon in a side-to-side fashion with silk sutures.  A colotomy and enterotomy were performed.  We then performed a side-to-side anastomosis with a GIA-75 stapler.  The opening was then closed with a TA 60 stapler.  I reinforced the staple line and omentum down to the staple line as well.  A wide anastomosis which appeared pink and well-perfused was created.  This was then placed back into the abdominal cavity.  We then thoroughly irrigated the abdomen with several liters of normal saline.  Hemostasis appeared to be achieved.  We then removed  the wound protector.  We changed gowns and gloves as well as drapes.  The patient's midline fascia was then closed with a running #1 looped PDS suture.  The incisions were anesthetized with Marcaine.  Irrigated with saline.  They were then closed with 4-0 Monocryl sutures and Dermabond.  Dressings were then applied.  The patient tolerated the procedure well.  All the counts were correct at the end of the procedure.  The patient was then extubated in the operating room and taken in a stable condition to the recovery room.          Tracy [name] A   Date: 09/01/2018  Time: 9:20 AM

## 2018-09-01 NOTE — Anesthesia Preprocedure Evaluation (Addendum)
Anesthesia Evaluation  Patient identified by MRN, date of birth, ID band Patient awake    Reviewed: Allergy & Precautions, NPO status , Patient's Chart, lab work & pertinent test results, reviewed documented beta blocker date and time   History of Anesthesia Complications Negative for: history of anesthetic complications  Airway Mallampati: III  TM Distance: >3 FB Neck ROM: Full    Dental  (+) Poor Dentition, Chipped,    Pulmonary sleep apnea and Continuous Positive Airway Pressure Ventilation , former smoker,    breath sounds clear to auscultation       Cardiovascular + pacemaker  Rhythm:Irregular  Sick sinus syndrome with pacemaker, no recent syncopal events    Neuro/Psych negative neurological ROS  negative psych ROS   GI/Hepatic Colon mass, chronic diarrhea    Endo/Other  negative endocrine ROS  Renal/GU negative Renal ROS     Musculoskeletal negative musculoskeletal ROS (+)   Abdominal   Peds  Hematology negative hematology ROS (+)   Anesthesia Other Findings   Reproductive/Obstetrics                            Anesthesia Physical Anesthesia Plan  ASA: II  Anesthesia Plan: General   Post-op Pain Management:    Induction: Intravenous  PONV Risk Score and Plan: 2 and Ondansetron and Dexamethasone  Airway Management Planned: Oral ETT  Additional Equipment: None  Intra-op Plan:   Post-operative Plan: Extubation in OR  Informed Consent: I have reviewed the patients History and Physical, chart, labs and discussed the procedure including the risks, benefits and alternatives for the proposed anesthesia with the patient or authorized representative who has indicated his/her understanding and acceptance.   Dental advisory given  Plan Discussed with: CRNA and Surgeon  Anesthesia Plan Comments:         Anesthesia Quick Evaluation

## 2018-09-01 NOTE — Interval H&P Note (Signed)
History and Physical Interval Note: no change in H and P  09/01/2018 6:53 AM  Tracy [name]  has presented today for surgery, with the diagnosis of COLON MASS  The various methods of treatment have been discussed with the patient and family. After consideration of risks, benefits and other options for treatment, the patient has consented to  Procedure(s): LAPAROSCOPIC PARTIAL COLECTOMY (N/A) as a surgical intervention .  The patient's history has been reviewed, patient examined, no change in status, stable for surgery.  I have reviewed the patient's chart and labs.  Questions were answered to the patient's satisfaction.     Vanessa Alesi A

## 2018-09-02 ENCOUNTER — Other Ambulatory Visit: Payer: Self-pay | Admitting: Internal Medicine

## 2018-09-02 LAB — BPAM RBC
Blood Product Expiration Date: 201911192359
Blood Product Expiration Date: 201911212359
ISSUE DATE / TIME: 201910251533
ISSUE DATE / TIME: 201910252014
Unit Type and Rh: 9500
Unit Type and Rh: 9500

## 2018-09-02 LAB — TYPE AND SCREEN
ABO/RH(D): O NEG
Antibody Screen: NEGATIVE
Unit division: 0
Unit division: 0

## 2018-09-02 LAB — BASIC METABOLIC PANEL
Anion gap: 5 (ref 5–15)
BUN: 8 mg/dL (ref 8–23)
CO2: 26 mmol/L (ref 22–32)
Calcium: 8.7 mg/dL — ABNORMAL LOW (ref 8.9–10.3)
Chloride: 109 mmol/L (ref 98–111)
Creatinine, Ser: 0.85 mg/dL (ref 0.61–1.24)
GFR calc Af Amer: >60 mL/min (ref >60)
GFR calc non Af Amer: >60 mL/min (ref >60)
Glucose, Bld: 115 mg/dL — ABNORMAL HIGH (ref 70–99)
Potassium: 3.8 mmol/L (ref 3.5–5.1)
Sodium: 140 mmol/L (ref 135–145)

## 2018-09-02 LAB — CBC
HCT: 25.4 % — ABNORMAL LOW (ref 39.0–52.0)
Hemoglobin: 7.1 g/dL — ABNORMAL LOW (ref 13.0–17.0)
MCH: 18 pg — ABNORMAL LOW (ref 26.0–34.0)
MCHC: 28 g/dL — ABNORMAL LOW (ref 30.0–36.0)
MCV: 64.5 fL — ABNORMAL LOW (ref 80.0–100.0)
Platelets: 341 10*3/uL (ref 150–400)
RBC: 3.94 MIL/uL — ABNORMAL LOW (ref 4.22–5.81)
RDW: 24.7 % — ABNORMAL HIGH (ref 11.5–15.5)
WBC: 10.5 10*3/uL (ref 4.0–10.5)
nRBC: 0 % (ref 0.0–0.2)

## 2018-09-02 NOTE — Progress Notes (Signed)
[facility] Office:  [phone] General Surgery Progress Note   LOS: 1 day  POD -  1 Day Post-Op  Chief Complaint: Right colon mass  Assessment and Plan: 1. LAPAROSCOPIC ASSISTED PARTIAL Right COLECTOMY - 09/02/2018 - D. Alexei Doswell  On [employer] full liquids.  2.  DVT prophylaxis - Lovenox   Active Problems:   Colon cancer (HCC)  Subjective:  Doing well. Taking full liquids.  He has had some BM's.  Wife at bedside.  Objective:   Vitals:   09/01/18 2344 09/02/18 0512  BP: 139/66 113/67  Pulse: 76 (!) 47  Resp: 20 18  Temp: 98.6 F (37 C) 97.6 F (36.4 C)  SpO2: 96% 95%     Intake/Output from previous day:  10/25 0701 - 10/26 0700 In: 3907.8 [P.O.:900; I.V.:1877.8; Blood:630; IV XVQMGQQPY:195] Out: 1011 [Urine:960; Stool:1; Blood:50]  Intake/Output this shift:  No intake/output data recorded.   Physical Exam:   General: WN WM who is alert and oriented.    HEENT: Normal. Pupils equal. .   Lungs: Clear.  IS = 1,400 cc   Abdomen: Mild distention.  Has BS.   Wound: Clean   Lab Results:    Recent Labs    09/01/18 1214 09/02/18 0408  WBC 12.6* 10.5  HGB 5.7* 7.1*  HCT 21.4* 25.4*  PLT 412* 341    BMET   Recent Labs    09/01/18 0629 09/01/18 0829 09/02/18 0408  NA 139 140 140  K 3.3* 3.6 3.8  CL 104  --  109  CO2 23  --  26  GLUCOSE 186* 119* 115*  BUN 10  --  8  CREATININE 0.98  --  0.85  CALCIUM 8.9  --  8.7*    PT/INR  No results for input(s): LABPROT, INR in the last 72 hours.  ABG  No results for input(s): PHART, HCO3 in the last 72 hours.  Invalid input(s): PCO2, PO2   Studies/Results:  Dg Abd 1 View  Result Date: 09/01/2018 CLINICAL DATA:  Missing instrument count. EXAM: ABDOMEN - 1 VIEW COMPARISON:  None. FINDINGS: The bowel gas pattern is normal. No radio-opaque calculi or other significant radiographic abnormality are seen. No radiopaque foreign body is noted. IMPRESSION: No radiopaque foreign body seen.  These results were called by telephone at the time of interpretation on 09/01/2018 at 9:38 am to Sharyn Dross, who verbally acknowledged these results. Electronically Signed   By: Marijo Conception, M.D.   On: 09/01/2018 09:38     Anti-infectives:   Anti-infectives (From admission, onward)   Start     Dose/Rate Route Frequency Ordered Stop   09/01/18 0630  cefoTEtan in Dextrose 5% (CEFOTAN) IVPB 2 g     2 g 100 mL/hr over 30 Minutes Intravenous To ShortStay Surgical 08/31/18 1131 09/01/18 0804      Alphonsa Overall, MD, FACS Pager: [facility] Office: [phone] 09/02/2018

## 2018-09-03 NOTE — Progress Notes (Signed)
[facility] Office:  [phone] General Surgery Progress Note   LOS: 2 days  POD -  2 Days Post-Op  Chief Complaint: Right colon mass  Assessment and Plan: 1. LAPAROSCOPIC ASSISTED PARTIAL Right COLECTOMY - 09/02/2018 - D. Elizabeth Paulsen  On Entereg  Advance to reg diet  2.  Anemia  Hgb - 7.1 - 09/02/2018  Will recheck in AM. 3.  DVT prophylaxis - Lovenox   Active Problems:   Colon cancer (HCC)  Subjective:  Doing very well.  Ready to advance diet.  Objective:   Vitals:   09/02/18 2240 09/03/18 0501  BP: (!) 125/109 130/89  Pulse: (!) 56 64  Resp: 16 16  Temp: 98.4 F (36.9 C) (!) 97.5 F (36.4 C)  SpO2: 96% 98%     Intake/Output from previous day:  10/26 0701 - 10/27 0700 In: 720 [P.O.:720] Out: 4 [Urine:3; Stool:1]  Intake/Output this shift:  No intake/output data recorded.   Physical Exam:   General: WN WM who is alert and oriented.    HEENT: Normal. Pupils equal. .   Lungs: Clear.     Abdomen: Sofe.  Has BS.   Wound: Clean   Lab Results:    Recent Labs    09/01/18 1214 09/02/18 0408  WBC 12.6* 10.5  HGB 5.7* 7.1*  HCT 21.4* 25.4*  PLT 412* 341    BMET   Recent Labs    09/01/18 0629 09/01/18 0829 09/02/18 0408  NA 139 140 140  K 3.3* 3.6 3.8  CL 104  --  109  CO2 23  --  26  GLUCOSE 186* 119* 115*  BUN 10  --  8  CREATININE 0.98  --  0.85  CALCIUM 8.9  --  8.7*    PT/INR  No results for input(s): LABPROT, INR in the last 72 hours.  ABG  No results for input(s): PHART, HCO3 in the last 72 hours.  Invalid input(s): PCO2, PO2   Studies/Results:  No results found.   Anti-infectives:   Anti-infectives (From admission, onward)   Start     Dose/Rate Route Frequency Ordered Stop   09/01/18 0630  cefoTEtan in Dextrose 5% (CEFOTAN) IVPB 2 g     2 g 100 mL/hr over 30 Minutes Intravenous To ShortStay Surgical 08/31/18 1131 09/01/18 0804      Alphonsa Overall, MD, FACS Pager: [facility] Office:  [phone] 09/03/2018

## 2018-09-03 NOTE — Anesthesia Postprocedure Evaluation (Signed)
Anesthesia Post Note  Patient: Tracy [name]  Procedure(s) Performed: LAPAROSCOPIC ASSISTED PARTIAL COLECTOMY (N/A Abdomen)     Patient location during evaluation: PACU Anesthesia Type: General Level of consciousness: awake and alert Pain management: pain level controlled Vital Signs Assessment: post-procedure vital signs reviewed and stable Respiratory status: spontaneous breathing, nonlabored ventilation, respiratory function stable and patient connected to nasal cannula oxygen Cardiovascular status: blood pressure returned to baseline and stable Postop Assessment: no apparent nausea or vomiting Anesthetic complications: no    Last Vitals:  Vitals:   09/03/18 1230 09/03/18 1318  BP: 122/78 117/66  Pulse: 60 67  Resp:  17  Temp:  36.8 C  SpO2: 96% 96%    Last Pain:  Vitals:   09/03/18 1952  TempSrc:   PainSc: 5                  Xiomara Sevillano

## 2018-09-04 ENCOUNTER — Encounter (HOSPITAL_COMMUNITY): Payer: Self-pay | Admitting: Surgery

## 2018-09-04 LAB — CBC WITH DIFFERENTIAL/PLATELET
Abs Immature Granulocytes: 0.04 10*3/uL (ref 0.00–0.07)
Basophils Absolute: 0.1 10*3/uL (ref 0.0–0.1)
Basophils Relative: 1 %
Eosinophils Absolute: 0.7 10*3/uL — ABNORMAL HIGH (ref 0.0–0.5)
Eosinophils Relative: 7 %
HCT: 25.2 % — ABNORMAL LOW (ref 39.0–52.0)
Hemoglobin: 7 g/dL — ABNORMAL LOW (ref 13.0–17.0)
Immature Granulocytes: 0 %
Lymphocytes Relative: 15 %
Lymphs Abs: 1.4 10*3/uL (ref 0.7–4.0)
MCH: 18 pg — ABNORMAL LOW (ref 26.0–34.0)
MCHC: 27.8 g/dL — ABNORMAL LOW (ref 30.0–36.0)
MCV: 64.9 fL — ABNORMAL LOW (ref 80.0–100.0)
Monocytes Absolute: 0.8 10*3/uL (ref 0.1–1.0)
Monocytes Relative: 9 %
Neutro Abs: 6.3 10*3/uL (ref 1.7–7.7)
Neutrophils Relative %: 68 %
Platelets: 339 10*3/uL (ref 150–400)
RBC: 3.88 MIL/uL — ABNORMAL LOW (ref 4.22–5.81)
RDW: 26.1 % — ABNORMAL HIGH (ref 11.5–15.5)
WBC: 9.2 10*3/uL (ref 4.0–10.5)
nRBC: 0 % (ref 0.0–0.2)

## 2018-09-04 MED ORDER — TRAMADOL HCL 50 MG PO TABS: 50.0000 mg | ORAL_TABLET | Freq: Four times a day (QID) | ORAL | 1 refills | Status: DC | PRN

## 2018-09-04 NOTE — Progress Notes (Signed)
Patient given discharge instructions. Patient verbalized understanding and left unit in stable condition.

## 2018-09-04 NOTE — Discharge Instructions (Signed)
[facility], [place] [phone]  OPEN ABDOMINAL SURGERY: POST OP INSTRUCTIONS  Always review your discharge instruction sheet given to you by the facility where your surgery was performed.  IF YOU HAVE DISABILITY OR FAMILY LEAVE FORMS, YOU MUST BRING THEM TO THE OFFICE FOR PROCESSING.  PLEASE DO NOT GIVE THEM TO YOUR DOCTOR.  1. A prescription for pain medication may be given to you upon discharge.  Take your pain medication as prescribed, if needed.  If narcotic pain medicine is not needed, then you may take acetaminophen (Tylenol) or ibuprofen (Advil) as needed. 2. Take your usually prescribed medications unless otherwise directed. 3. If you need a refill on your pain medication, please contact your pharmacy. They will contact our office to request authorization.  Prescriptions will not be filled after 5pm or on week-ends. 4. You should follow a light diet the first few days after arrival home, such as soup and crackers, pudding, etc.unless your doctor has advised otherwise. A high-fiber, low fat diet can be resumed as tolerated.   Be sure to include lots of fluids daily. Most patients will experience some swelling and bruising on the chest and neck area.  Ice packs will help.  Swelling and bruising can take several days to resolve 5. Most patients will experience some swelling and bruising in the area of the incision. Ice pack will help. Swelling and bruising can take several days to resolve..  6. It is common to experience some constipation if taking pain medication after surgery.  Increasing fluid intake and taking a stool softener will usually help or prevent this problem from occurring.  A mild laxative (Milk of Magnesia or Miralax) should be taken according to package directions if there are no bowel movements after 48 hours. 7.  You may have steri-strips (small skin tapes) in place directly over the incision.  These strips should be left on the skin for 7-10 days.  If your  surgeon used skin glue on the incision, you may shower in 24 hours.  The glue will flake off over the next 2-3 weeks.  Any sutures or staples will be removed at the office during your follow-up visit. You may find that a light gauze bandage over your incision may keep your staples from being rubbed or pulled. You may shower and replace the bandage daily. 8. ACTIVITIES:  You may resume regular (light) daily activities beginning the next day--such as daily self-care, walking, climbing stairs--gradually increasing activities as tolerated.  You may have sexual intercourse when it is comfortable.  Refrain from any heavy lifting or straining until approved by your doctor. a. You may drive when you no longer are taking prescription pain medication, you can comfortably wear a seatbelt, and you can safely maneuver your car and apply brakes b. Return to Work: ___________________________________ 75. You should see your doctor in the office for a follow-up appointment approximately two weeks after your surgery.  Make sure that you call for this appointment within a day or two after you arrive home to insure a convenient appointment time. OTHER INSTRUCTIONS: OK TO SHOWER TYLENOL AND IBUPROFEN FOR PAIN START A MULTIVITAMIN WITH IRON NO LIFTING MORE THAN 15 TO 20 POUNDS FOR 6 WEEKS _____________________________________________________________ _____________________________________________________________  WHEN TO CALL YOUR DOCTOR: 1. Fever over 101.0 2. Inability to urinate 3. Nausea and/or vomiting 4. Extreme swelling or bruising 5. Continued bleeding from incision. 6. Increased pain, redness, or drainage from the incision. 7. Difficulty swallowing or breathing  8. Muscle cramping or spasms. 9. Numbness or tingling in hands or feet or around lips.  The clinic staff is available to answer your questions during regular business hours.  Please dont hesitate to call and ask to speak to one of the nurses if you  have concerns.  For further questions, please visit [email or web address]

## 2018-09-04 NOTE — Progress Notes (Signed)
Patient ID: Tracy [name], male   DOB: 02/18/1956, 62 y.o.   MRN: [id number]  Had a good weekend Tolerating po and having BM's Looks great, abdomen soft  Plan: Discharge home

## 2018-09-04 NOTE — Discharge Summary (Signed)
Physician Discharge Summary  Patient ID: Bodi Palmeri MRN: [id number] DOB/AGE: 61-10-1956 61 y.o.  Admit date: 09/01/2018 Discharge date: 09/04/2018  Admission Diagnoses:  Discharge Diagnoses:  Active Problems:   Colon cancer ([place]) chronic blood loss anemia  Discharged Condition: good  Hospital Course: admitted s/p surgery. Did well.  Quick return of bowel function.  Ambulating well. Discharged home POD#3  Consults: None  Significant Diagnostic Studies:   Treatments: surgery: laparoscopic assisted ileocolectomy  Discharge Exam: Blood pressure 116/79, pulse (!) 54, temperature 98 F (36.7 C), temperature source Oral, resp. rate 16, height 6' (1.829 m), weight 86.2 kg, SpO2 97 %. General appearance: alert, cooperative and no distress Resp: clear to auscultation bilaterally Incision/Wound:abdomen soft, non tender, incision healing well  Disposition: Discharge disposition: 01-Home or Self Care       Discharge Instructions    Diet - low sodium heart healthy   Complete by:  As directed    Increase activity slowly   Complete by:  As directed      Allergies as of 09/04/2018   No Known Allergies     Medication List    TAKE these medications   atorvastatin 10 MG tablet Commonly known as:  LIPITOR TAKE 1 TABLET (10 MG TOTAL) BY MOUTH DAILY.... MAX OF 30 DAYS ON INSURANCE What changed:  See the new instructions.   ibuprofen 200 MG tablet Commonly known as:  ADVIL,MOTRIN Take 400 mg by mouth every 8 (eight) hours as needed (for pain.).   loperamide 2 MG capsule Commonly known as:  IMODIUM Take 2 mg by mouth daily.   metoprolol succinate 50 MG 24 hr tablet Commonly known as:  TOPROL-XL TAKE 1 TABLET (50 MG TOTAL) BY MOUTH DAILY. What changed:  See the new instructions.   multivitamin with minerals Tabs tablet Take 1 tablet by mouth daily.   PARoxetine 20 MG tablet Commonly known as:  PAXIL Take 1 tablet (20 mg total) by mouth daily.   traMADol 50 MG  tablet Commonly known as:  ULTRAM Take 1 tablet (50 mg total) by mouth every 6 (six) hours as needed for severe pain.      Follow-up Information    Coralie Keens, MD. Schedule an appointment as soon as possible for a visit in 3 week(s).   Specialty:  General Surgery Contact information: [address] [place] [address] [phone]           Signed: Harl Bowie 09/04/2018, 6:42 AM

## 2018-09-12 ENCOUNTER — Encounter: Payer: Self-pay | Admitting: Oncology

## 2018-09-12 ENCOUNTER — Telehealth: Payer: Self-pay | Admitting: Oncology

## 2018-09-12 NOTE — Telephone Encounter (Signed)
Correction for appt. Pt has been scheduled to see Dr. Benay Spice on 11/13 at 2pm. Letter mailed.

## 2018-09-12 NOTE — Telephone Encounter (Signed)
New patient referral received from Dr. Ninfa Linden for mass of colon. Pt has been scheduled to see Dr. Benay Spice on 11/19 at 2pm. Letter mailed to the pt.

## 2018-09-13 ENCOUNTER — Telehealth: Payer: Self-pay | Admitting: Hematology

## 2018-09-13 NOTE — Telephone Encounter (Signed)
Pt's appt has been rescheduled to see Dr. Burr Medico on 11/15 at 230pm. Pt's wife wanted to be at the appt.

## 2018-09-15 LAB — CUP PACEART REMOTE DEVICE CHECK
Battery Impedance: 547 Ohm
Battery Remaining Longevity: 99 mo
Battery Voltage: 2.78 V
Brady Statistic AP VP Percent: 9 %
Brady Statistic AP VS Percent: 0 %
Brady Statistic AS VP Percent: 0 %
Brady Statistic AS VS Percent: 91 %
Date Time Interrogation Session: 20191014121804
Implantable Lead Implant Date: 20040329
Implantable Lead Implant Date: 20040329
Implantable Lead Location: 753859
Implantable Lead Location: 753860
Implantable Lead Model: 4092
Implantable Lead Model: 4592
Implantable Pulse Generator Implant Date: 20120918
Lead Channel Impedance Value: 505 Ohm
Lead Channel Impedance Value: 591 Ohm
Lead Channel Pacing Threshold Amplitude: 0.75 V
Lead Channel Pacing Threshold Pulse Width: 0.4 ms
Lead Channel Setting Pacing Amplitude: 2 V
Lead Channel Setting Pacing Amplitude: 2.5 V
Lead Channel Setting Pacing Pulse Width: 0.4 ms
Lead Channel Setting Sensing Sensitivity: 2 mV

## 2018-09-18 ENCOUNTER — Telehealth: Payer: Self-pay | Admitting: Oncology

## 2018-09-18 NOTE — Telephone Encounter (Signed)
Pt preferred to be scheduled with Dr. Benay Spice for med onc. Spoke to Mrs. Mancel Bale and rescheduled Tracy [name] to see Dr. Benay Spice on 11/21 at 2pm.

## 2018-09-20 ENCOUNTER — Ambulatory Visit: Payer: 59 | Admitting: Oncology

## 2018-09-22 ENCOUNTER — Ambulatory Visit: Payer: 59 | Admitting: Hematology

## 2018-09-26 ENCOUNTER — Ambulatory Visit: Payer: 59 | Admitting: Oncology

## 2018-09-28 ENCOUNTER — Inpatient Hospital Stay: Payer: 59 | Attending: Oncology | Admitting: Oncology

## 2018-09-28 ENCOUNTER — Telehealth: Payer: Self-pay | Admitting: Oncology

## 2018-09-28 VITALS — BP 112/79 | HR 69 | Temp 98.8°F | Resp 18 | Ht 72.0 in | Wt 183.9 lb

## 2018-09-28 DIAGNOSIS — R599 Enlarged lymph nodes, unspecified: Secondary | ICD-10-CM

## 2018-09-28 DIAGNOSIS — G4730 Sleep apnea, unspecified: Secondary | ICD-10-CM

## 2018-09-28 DIAGNOSIS — Z87891 Personal history of nicotine dependence: Secondary | ICD-10-CM

## 2018-09-28 DIAGNOSIS — E785 Hyperlipidemia, unspecified: Secondary | ICD-10-CM

## 2018-09-28 DIAGNOSIS — I499 Cardiac arrhythmia, unspecified: Secondary | ICD-10-CM | POA: Diagnosis not present

## 2018-09-28 DIAGNOSIS — D509 Iron deficiency anemia, unspecified: Secondary | ICD-10-CM | POA: Diagnosis not present

## 2018-09-28 DIAGNOSIS — C182 Malignant neoplasm of ascending colon: Secondary | ICD-10-CM

## 2018-09-28 NOTE — Progress Notes (Signed)
Presents to new patient appointment with wife. Distress screening score "1". Main concern is waiting to confirm treatment plan/next steps. He reports some baseline anxiety and takes Paxil daily.

## 2018-09-28 NOTE — Progress Notes (Signed)
Tracy [name]   Requesting MD: Tracy [name] 62 y.o.  1956/02/19    Reason for [name]: Colon cancer   HPI: Mr. was taken to a screening colonoscopy by Dr. Paulita [name] on 08/08/2018.  A partially obstructing mass was found at the hepatic flexure.  The mass was circumferential.  The mass could not be traversed.  The mass was biopsied and tattooed.  A 6 mm polyp was found in the proximal transverse colon.  The polyp was not removed.  The pathology revealed an invasive well-differentiated adenocarcinoma.  There is preservation of mismatch repair protein expression.  CTs of the chest, abdomen, and pelvis on 08/16/2018 healed a mass at the cecum adjacent to the ileocecal valve.  No lymphadenopathy.  He was referred to Dr. Ninfa [name].  He was taken to a laparoscopic assisted ileocolectomy on 09/01/2018.  A mass was noted in the cecum near the terminal ileum.  Slightly enlarged lymph nodes were noted in the mesentery.  No gross evidence of metastatic disease.  The distal ileum and proximal transverse colon were transected and a side-to-side anastomosis was created The pathology ([id number]) revealed an invasive moderate to poorly differentiated adenocarcinoma.  14 lymph nodes were negative for metastatic carcinoma.  The resection margins were negative.  The tumor was located at the cecum.  Tumor invaded the visceral peritoneum.  No lymphovascular invasion.  Perineural invasion is present.  No tumor deposit.  No macroscopic tumor perforation.  The tumor returned MSI-stable with no loss of mismatch repair protein expression.  He was noted to have severe microcytic anemia on hospital admission with a hemoglobin of 5.7.  He was transfused with 2 units of packed red blood cells on 09/01/2018.  The hemoglobin returned at 7 on 09/04/2018.  Past Medical History:  Diagnosis Date  . Bradycardia 01/01/03   s/p PPM (MDT)  . Cancer (HCC)-cecum  09/01/2018   colon  cancer  . Dyslipidemia   . Myopia   . OSA (obstructive sleep apnea) 07/21/2011   NPSG 2012:  AHI 88/hr with both obstructive and central events.  Auto 10/2013:  Optimal pressure 16cm.    . Presence of permanent cardiac pacemaker    due to node dysfunction  . Sleep apnea 9/12   severe  . Syncope 2008   dysautonomia    Past Surgical History:  Procedure Laterality Date  . CARDIAC CATHETERIZATION  2008   no CAD  . HERNIA REPAIR Left    inguinal hernia  . LAPAROSCOPIC PARTIAL COLECTOMY N/A 09/01/2018   Procedure: LAPAROSCOPIC ASSISTED PARTIAL COLECTOMY;  Surgeon: Tracy Keens, MD;  Location: [facility];  Service: General;  Laterality: N/A;  . MENISCUS REPAIR Left   . PACEMAKER PLACEMENT  01/01/03   generator change (MDT) by Dr Tracy [name] 2012  . PARTIAL COLECTOMY  09/01/2018    Medications: Reviewed  Allergies: No Known Allergies  Family history: No family history of cancer.  There is a family history of heart disease.  A sister has ALS.  Social History:   He lives with his wife in [place].  He is a retired [employer].  He quit smoking cigars 2 months ago.  He quit alcohol 6 months ago.  He was transfused with packed red blood cells while hospitalized for colon cancer surgery.  No risk factor for HIV or hepatitis.  ROS:   Positives include: Intermittent "loose "stool attributed to cardiac medications  A complete ROS was otherwise negative.  Physical Exam:  Blood pressure  112/79, pulse 69, temperature 98.8 F (37.1 C), temperature source Oral, resp. rate 18, height 6' (1.829 m), weight 183 lb 14.4 oz (83.4 kg), SpO2 99 %.  HEENT: Oropharynx without visible mass, neck without mass Lungs: Clear bilaterally, no respiratory distress Cardiac: Regular rate and rhythm Abdomen: Healed midline incision, no hepatosplenomegaly, no mass GU: Testes without mass Vascular: No leg edema Lymph nodes: No cervical, supraclavicular, axillary, or inguinal nodes Neurologic: Alert and  oriented, the motor exam appears intact in the upper and lower extremities Skin: No rash Musculoskeletal: No spine tenderness   LAB:  CBC  Lab Results  Component Value Date   WBC 9.2 09/04/2018   HGB 7.0 (L) 09/04/2018   HCT 25.2 (L) 09/04/2018   MCV 64.9 (L) 09/04/2018   PLT 339 09/04/2018   NEUTROABS 6.3 09/04/2018        CMP  Lab Results  Component Value Date   NA 140 09/02/2018   K 3.8 09/02/2018   CL 109 09/02/2018   CO2 26 09/02/2018   GLUCOSE 115 (H) 09/02/2018   BUN 8 09/02/2018   CREATININE 0.85 09/02/2018   CALCIUM 8.7 (L) 09/02/2018   PROT 6.6 08/16/2014   ALBUMIN 4.2 08/16/2014   AST 25 08/16/2014   ALT 32 08/16/2014   ALKPHOS 65 08/16/2014   BILITOT 0.7 08/16/2014   GFRNONAA >60 09/02/2018   GFRAA >60 09/02/2018      Imaging: CT images from 08/16/2018-reviewed   Assessment/Plan:   1. Adenocarcinoma the cecum, stage IIb (T4aN0), status post a right colectomy 09/01/2018  0/14 lymph nodes positive, perineural invasion present, no macroscopic tumor perforation, no lymphovascular invasion  MSI-stable, no loss of mismatch repair protein expression  Colonoscopy 08/08/2018- ulcerated partially obstructing mass at the "hepatic flexure "-biopsy revealed invasive well-differentiated adenocarcinoma, no loss of mismatch repair protein expression, 6 mm polyp in the proximal transverse colon-not removed  CTs 08/16/2018- cecal mass, no evidence of metastatic disease  2. Arrhythmia-status post permanent pacemaker placement 3. Microcytic anemia secondary to #1 4. Sleep apnea   Disposition:   Tracy [name] has been diagnosed with adenocarcinoma of the cecum.  I reviewed the details of the surgical pathology report and discussed adjuvant treatment options with Tracy [name] and his wife.  He has been diagnosed with stage II colon cancer.  He has a good prognosis for a long-term disease-free survival.  He understands the majority of patients with stage II colon  cancer have a good prognosis and there is no clear indication for adjuvant chemotherapy.  However chemotherapy is recommended in patients with "high risk "stage II tumors.  He has high risk features including the T4 tumor and perineural invasion.  He is young and otherwise in good health.  I recommend adjuvant chemotherapy.  We discussed the data to support adjuvant 5-fluorouracil and oxaliplatin in patients with resected colon cancer.  We discussed the expected benefit from 5-fluorouracil and oxaliplatin.  I reviewed recent data supporting the use of 3 months of CAPOX as opposed to the traditional 6 months of adjuvant chemotherapy.  Tracy [name] agrees to proceed with 3 months of adjuvant CAPOX.  We reviewed potential toxicities associated with this chemotherapy regimen including the chance for nausea/vomiting, mucositis, diarrhea, alopecia, and hematologic toxicity.  We discussed the rash, sun sensitivity, hyperpigmentation, hand/foot syndrome, and cardiac toxicity associated with 5-fluorouracil.  We reviewed the allergic reaction and various types of neuropathy seen with oxaliplatin.  Tracy [name] will attend a chemotherapy teaching class.  The plan is to begin adjuvant  CAPOX on 10/09/2018.  He will return for an office visit and cycle 2 chemotherapy 10/30/2018.  He does not appear to have hereditary non-polyposis colon cancer syndrome, but his family members are at increased risk of developing a rectal cancer and should receive appropriate screening.  He should undergo a one year surveillance colonoscopy.  We will plan for one year surveillance CT scans.  I will present his case at the GI tumor conference.  Betsy Coder, MD  09/28/2018, 3:58 PM

## 2018-09-28 NOTE — Progress Notes (Signed)
  Oncology Nurse Navigator Documentation    Met with Tracy [name] and his wife. Explained role of nurse navigator. Educational information provided on colorectal cancer staging, multiple chemo drugs discussed during consult as well as supportive services at [facility].  Contact names and phone numbers were provided for entire [facility] team.  Patient has adult children that live near them and supportive family and friends.   No barriers to care identified at present time.  Will continue to follow as needed

## 2018-09-28 NOTE — Progress Notes (Signed)
START ON PATHWAY REGIMEN - Colorectal     A cycle is every 21 days:     Capecitabine      Oxaliplatin   **Always confirm dose/schedule in your pharmacy ordering system**  Patient Characteristics: Postoperative without Neoadjuvant Therapy (Pathologic Staging), Colon, Stage IIB/C Therapeutic Status: Postoperative without Neoadjuvant Therapy (Pathologic Staging) Tumor Location: Colon AJCC M Category: Staged < 8th Ed. AJCC T Category: Staged < 8th Ed. AJCC N Category: Staged < 8th Ed. AJCC 8 Stage Grouping: Staged < 8th Ed. Intent of Therapy: Curative Intent, Discussed with Patient

## 2018-09-28 NOTE — Telephone Encounter (Signed)
Printed calendar and avs. °

## 2018-09-29 ENCOUNTER — Telehealth: Payer: Self-pay

## 2018-09-29 ENCOUNTER — Other Ambulatory Visit: Payer: Self-pay | Admitting: *Deleted

## 2018-09-29 DIAGNOSIS — R001 Bradycardia, unspecified: Secondary | ICD-10-CM

## 2018-09-29 DIAGNOSIS — I493 Ventricular premature depolarization: Secondary | ICD-10-CM

## 2018-09-29 MED ORDER — PROCHLORPERAZINE MALEATE 10 MG PO TABS: 10.0000 mg | ORAL_TABLET | Freq: Four times a day (QID) | ORAL | 1 refills | Status: DC | PRN

## 2018-09-29 NOTE — Telephone Encounter (Signed)
Outreach made to Pt.  Spoke with wife.  Advised per Dr. Rayann Heman would suggest an ECHO prior to starting chemotherapy.  Wife indicates understanding.  Will order ECHO and send to scheduling.

## 2018-09-29 NOTE — Addendum Note (Signed)
Addended by: Willeen Cass A on: 09/29/2018 08:11 AM   Modules accepted: Orders

## 2018-09-29 NOTE — Telephone Encounter (Signed)
-----   Message from Thompson Grayer, MD sent at 09/28/2018 10:36 PM EST ----- I am sorry to hear of this diagnosis.  Proceed with chemotherapy as planned. He has not had an echo in quite some time.  I will obtain an echo to serve as a baseline.  Thanks for the note.  Sonia Baller, please call Mr Lamountain and schedule echo.   ----- Message ----- From: Ladell Pier, MD Sent: 09/28/2018   5:29 PM EST To: Thompson Grayer, MD  Mr. Burkemper been diagnosed with colon cancer.  The plan is to begin adjuvant chemotherapy with capecitabine and oxaliplatin Capecitabine is a 5-fluorouracil derivative and has rare cardiac ischemia and arrhythmia side effects  I know he has a pacemaker in place.  I do not feel the potential for cardiac toxicity is a contraindication to chemotherapy unless you feel differently  Thanks for looking at this,  Julieanne Manson

## 2018-10-02 ENCOUNTER — Telehealth: Payer: Self-pay | Admitting: Pharmacist

## 2018-10-02 ENCOUNTER — Inpatient Hospital Stay: Payer: 59

## 2018-10-02 ENCOUNTER — Encounter: Payer: Self-pay | Admitting: Oncology

## 2018-10-02 DIAGNOSIS — Z87891 Personal history of nicotine dependence: Secondary | ICD-10-CM | POA: Diagnosis not present

## 2018-10-02 DIAGNOSIS — E785 Hyperlipidemia, unspecified: Secondary | ICD-10-CM | POA: Diagnosis not present

## 2018-10-02 DIAGNOSIS — R599 Enlarged lymph nodes, unspecified: Secondary | ICD-10-CM | POA: Diagnosis not present

## 2018-10-02 DIAGNOSIS — I499 Cardiac arrhythmia, unspecified: Secondary | ICD-10-CM | POA: Diagnosis not present

## 2018-10-02 DIAGNOSIS — C182 Malignant neoplasm of ascending colon: Secondary | ICD-10-CM

## 2018-10-02 DIAGNOSIS — D509 Iron deficiency anemia, unspecified: Secondary | ICD-10-CM | POA: Diagnosis not present

## 2018-10-02 DIAGNOSIS — G4730 Sleep apnea, unspecified: Secondary | ICD-10-CM | POA: Diagnosis not present

## 2018-10-02 LAB — CMP (CANCER CENTER ONLY)
ALT: 14 U/L (ref 0–44)
AST: 15 U/L (ref 15–41)
Albumin: 4.1 g/dL (ref 3.5–5.0)
Alkaline Phosphatase: 75 U/L (ref 38–126)
Anion gap: 9 (ref 5–15)
BUN: 16 mg/dL (ref 8–23)
CO2: 28 mmol/L (ref 22–32)
Calcium: 9.4 mg/dL (ref 8.9–10.3)
Chloride: 104 mmol/L (ref 98–111)
Creatinine: 0.86 mg/dL (ref 0.61–1.24)
GFR, Est AFR Am: >60 mL/min (ref >60)
GFR, Estimated: >60 mL/min (ref >60)
Glucose, Bld: 105 mg/dL — ABNORMAL HIGH (ref 70–99)
Potassium: 4.3 mmol/L (ref 3.5–5.1)
Sodium: 141 mmol/L (ref 135–145)
Total Bilirubin: 0.3 mg/dL (ref 0.3–1.2)
Total Protein: 7.5 g/dL (ref 6.5–8.1)

## 2018-10-02 LAB — CBC WITH DIFFERENTIAL (CANCER CENTER ONLY)
Abs Immature Granulocytes: 0.03 10*3/uL (ref 0.00–0.07)
Basophils Absolute: 0.1 10*3/uL (ref 0.0–0.1)
Basophils Relative: 1 %
Eosinophils Absolute: 0.7 10*3/uL — ABNORMAL HIGH (ref 0.0–0.5)
Eosinophils Relative: 8 %
HCT: 36.5 % — ABNORMAL LOW (ref 39.0–52.0)
Hemoglobin: 10.7 g/dL — ABNORMAL LOW (ref 13.0–17.0)
Immature Granulocytes: 0 %
Lymphocytes Relative: 17 %
Lymphs Abs: 1.5 10*3/uL (ref 0.7–4.0)
MCH: 22.4 pg — ABNORMAL LOW (ref 26.0–34.0)
MCHC: 29.3 g/dL — ABNORMAL LOW (ref 30.0–36.0)
MCV: 76.5 fL — ABNORMAL LOW (ref 80.0–100.0)
Monocytes Absolute: 0.7 10*3/uL (ref 0.1–1.0)
Monocytes Relative: 8 %
Neutro Abs: 5.9 10*3/uL (ref 1.7–7.7)
Neutrophils Relative %: 66 %
Platelet Count: 276 10*3/uL (ref 150–400)
RBC: 4.77 MIL/uL (ref 4.22–5.81)
WBC Count: 8.9 10*3/uL (ref 4.0–10.5)
nRBC: 0 % (ref 0.0–0.2)

## 2018-10-02 MED ORDER — CAPECITABINE 500 MG PO TABS: ORAL_TABLET | ORAL | 0 refills | Status: DC

## 2018-10-02 MED ORDER — XELODA 500 MG PO TABS: ORAL_TABLET | ORAL | 0 refills | Status: DC

## 2018-10-02 NOTE — Telephone Encounter (Signed)
Oral Oncology Pharmacist Encounter  Test claim for Xeloda at the [facility] long outpatient pharmacy revealed that no prior authorization is required for brand-name Xeloda. However, Xeloda prescriptions must be filled at [facility] per insurance requirement. Xeloda prescription (DAW) has been E scribed to [facility] in [place], [place]. Supporting information has been faxed to the pharmacy including insurance card, demographic information, and result of attempted prior authorization. Pharmacy contact has been emailed to alert of incoming prescription and planned start date of 10/09/2018.  Johny Drilling, PharmD, BCPS, BCOP  10/02/2018 12:27 PM Oral Oncology Clinic [phone]

## 2018-10-02 NOTE — Progress Notes (Signed)
Met w/ pt to introduce myself as his Arboriculturist.  Unfortunately there aren't any foundations offering copay assistance for his Dx.  I offered the [place], went over what it covers and showed him the expense sheet.  His wife stated if it's based on income they probably wouldn't qualify but thanked me for offering it.  I gave him my card for any questions or concerns he may have in the future.

## 2018-10-02 NOTE — Telephone Encounter (Signed)
Oral Oncology Pharmacist Encounter  Received new prescription for Xeloda (capecitabine) for the adjuvant treatment of stage IIB colon cancer with high risk features (positive for perineural invasion) in conjunction with oxaliplatin, planned duration 3 months (4 planned cycles).  Xeloda will be administered at ~850 mg/m2 orally BID for 14 days on, 7 days off, repeated every 21 days Oxaliplatin will be administered at 130 mg/m2 IV given every 3 weeks  Labs from Epic assessed, OK for treatment.  Current medication list in Epic reviewed, no DDIs with Xeloda identified  Prescription has been e-scribed to the [facility] for benefits analysis and approval.  Oral Oncology Clinic will continue to follow for insurance authorization, copayment issues, initial counseling and start date.  Johny Drilling, PharmD, BCPS, BCOP  10/02/2018 11:58 AM Oral Oncology Clinic [phone]

## 2018-10-03 ENCOUNTER — Telehealth: Payer: Self-pay | Admitting: *Deleted

## 2018-10-03 LAB — CEA (IN HOUSE-CHCC): CEA (CHCC-In House): 1.74 ng/mL (ref 0.00–5.00)

## 2018-10-03 NOTE — Telephone Encounter (Signed)
Notified of improvement in his anemia. F/U as scheduled. Wife reports he will receive delivery of his chemo pills tomorrow.

## 2018-10-03 NOTE — Telephone Encounter (Signed)
Oral Chemotherapy Pharmacist Encounter   I spoke with patient and wife, Tracy [name], for overview of: Xeloda (capecitabine) for the adjuvant treatment of stage IIB colon cancer with high risk features (positive for perineural invasion) in conjunction with oxaliplatin, planned duration 3 months (4 planned cycles).  They received formal chemotherapy education class yesterday with chemotherapy education nurse, so we briefly discussed counseling points.   Counseled patient on administration, dosing, side effects, monitoring, drug-food interactions, safe handling, storage, and disposal.  Patient will take Xeloda 500mg  tablets, 4 tablets (2000mg ) by mouth in AM and 3 tabs (1500mg ) by mouth in PM, within 30 minutes of finishing meals, on days 1-14 of each 21 day cycle.   Oxaliplatin will be infused on day 1 of each 21 day cycle.  Xeloda and oxaliplatin start date: 10/09/2018  Adverse effects include but are not limited to: fatigue, decreased blood counts, GI upset, diarrhea, and hand-foot syndrome.  Patient has anti-emetic on hand and knows to take it if nausea develops.   Patient will obtain anti diarrheal and alert the office of 4 or more loose stools above baseline.  Reviewed with patient importance of keeping a medication schedule and plan for any missed doses.  [name] voiced understanding and appreciation.   All questions answered. Medication reconciliation performed and medication/allergy list updated.  Patient and wife informed that prescription must be filled at [facility] per insurance requirement. Copayment for first 3-week supply is~$397 They stated that they will use their HSA to pay for copayment.  They will contact dispensing pharmacy immediately to arrange for first shipment of Xeloda so that patient has his pills on hand prior to start date of 10/09/2018.  They know to call the office with questions or concerns. Oral Oncology Clinic will continue to  follow.  Johny Drilling, PharmD, BCPS, BCOP  10/03/2018   11:05 AM Oral Oncology Clinic [phone]

## 2018-10-03 NOTE — Telephone Encounter (Signed)
-----   Message from Ladell Pier, MD sent at 10/02/2018  5:27 PM EST ----- Please call patient, the anemia is improved, follow-up as scheduled

## 2018-10-03 NOTE — Telephone Encounter (Signed)
Oral Oncology Pharmacist Encounter  Received notification from [facility] that they had been in touch with patient. Xeloda is scheduled to ship out today for delivery to their home on 10/04/2018.  Johny Drilling, PharmD, BCPS, BCOP  10/03/2018 11:58 AM Oral Oncology Clinic [phone]

## 2018-10-04 ENCOUNTER — Ambulatory Visit (HOSPITAL_COMMUNITY): Payer: 59 | Attending: Internal Medicine

## 2018-10-04 ENCOUNTER — Other Ambulatory Visit: Payer: Self-pay

## 2018-10-04 DIAGNOSIS — I493 Ventricular premature depolarization: Secondary | ICD-10-CM

## 2018-10-04 DIAGNOSIS — R001 Bradycardia, unspecified: Secondary | ICD-10-CM | POA: Diagnosis not present

## 2018-10-08 ENCOUNTER — Other Ambulatory Visit: Payer: Self-pay | Admitting: Oncology

## 2018-10-09 ENCOUNTER — Inpatient Hospital Stay: Payer: 59 | Attending: Oncology

## 2018-10-09 VITALS — BP 118/69 | HR 58 | Temp 98.7°F | Resp 16

## 2018-10-09 DIAGNOSIS — G4730 Sleep apnea, unspecified: Secondary | ICD-10-CM | POA: Insufficient documentation

## 2018-10-09 DIAGNOSIS — Z5112 Encounter for antineoplastic immunotherapy: Secondary | ICD-10-CM | POA: Insufficient documentation

## 2018-10-09 DIAGNOSIS — I499 Cardiac arrhythmia, unspecified: Secondary | ICD-10-CM | POA: Diagnosis not present

## 2018-10-09 DIAGNOSIS — C182 Malignant neoplasm of ascending colon: Secondary | ICD-10-CM | POA: Diagnosis present

## 2018-10-09 DIAGNOSIS — Z9221 Personal history of antineoplastic chemotherapy: Secondary | ICD-10-CM | POA: Diagnosis not present

## 2018-10-09 DIAGNOSIS — D630 Anemia in neoplastic disease: Secondary | ICD-10-CM | POA: Diagnosis not present

## 2018-10-09 MED ORDER — DEXAMETHASONE SODIUM PHOSPHATE 10 MG/ML IJ SOLN
10.0000 mg | Freq: Once | INTRAMUSCULAR | Status: AC
  Administered 2018-10-09: 10 mg via INTRAVENOUS

## 2018-10-09 MED ORDER — DEXAMETHASONE SODIUM PHOSPHATE 10 MG/ML IJ SOLN
INTRAMUSCULAR | Status: AC
  Filled 2018-10-09: qty 1

## 2018-10-09 MED ORDER — DEXTROSE 5 % IV SOLN
Freq: Once | INTRAVENOUS | Status: AC
  Administered 2018-10-09: 08:00:00 via INTRAVENOUS
  Filled 2018-10-09: qty 250

## 2018-10-09 MED ORDER — PALONOSETRON HCL INJECTION 0.25 MG/5ML
INTRAVENOUS | Status: AC
  Filled 2018-10-09: qty 5

## 2018-10-09 MED ORDER — OXALIPLATIN CHEMO INJECTION 100 MG/20ML
130.0000 mg/m2 | Freq: Once | INTRAVENOUS | Status: AC
  Administered 2018-10-09: 270 mg via INTRAVENOUS
  Filled 2018-10-09: qty 54

## 2018-10-09 MED ORDER — PALONOSETRON HCL INJECTION 0.25 MG/5ML
0.2500 mg | Freq: Once | INTRAVENOUS | Status: AC
  Administered 2018-10-09: 0.25 mg via INTRAVENOUS

## 2018-10-09 NOTE — Patient Instructions (Addendum)
[facility] Discharge Instructions for Patients Receiving Chemotherapy  Today you received the following chemotherapy agents:  Oxaliplatin.  To help prevent nausea and vomiting after your treatment, we encourage you to take your nausea medication as directed.   If you develop nausea and vomiting that is not controlled by your nausea medication, call the clinic.   BELOW ARE SYMPTOMS THAT SHOULD BE REPORTED IMMEDIATELY:  *FEVER GREATER THAN 100.5 F  *CHILLS WITH OR WITHOUT FEVER  NAUSEA AND VOMITING THAT IS NOT CONTROLLED WITH YOUR NAUSEA MEDICATION  *UNUSUAL SHORTNESS OF BREATH  *UNUSUAL BRUISING OR BLEEDING  TENDERNESS IN MOUTH AND THROAT WITH OR WITHOUT PRESENCE OF ULCERS  *URINARY PROBLEMS  *BOWEL PROBLEMS  UNUSUAL RASH Items with * indicate a potential emergency and should be followed up as soon as possible.  Feel free to call the clinic should you have any questions or concerns. The clinic phone number is [phone].  Please show the [facility] at check-in to the Emergency Department and triage nurse.  Oxaliplatin Injection What is this medicine? OXALIPLATIN (ox AL i PLA tin) is a chemotherapy drug. It targets fast dividing cells, like cancer cells, and causes these cells to die. This medicine is used to treat cancers of the colon and rectum, and many other cancers. This medicine may be used for other purposes; ask your health care provider or pharmacist if you have questions. COMMON BRAND NAME(S): Eloxatin What should I tell my health care provider before I take this medicine? They need to know if you have any of these conditions: -kidney disease -an unusual or allergic reaction to oxaliplatin, other chemotherapy, other medicines, foods, dyes, or preservatives -pregnant or trying to get pregnant -breast-feeding How should I use this medicine? This drug is given as an infusion into a vein. It is administered in a hospital or clinic by a  specially trained health care professional. Talk to your pediatrician regarding the use of this medicine in children. Special care may be needed. Overdosage: If you think you have taken too much of this medicine contact a poison control center or emergency room at once. NOTE: This medicine is only for you. Do not share this medicine with others. What if I miss a dose? It is important not to miss a dose. Call your doctor or health care professional if you are unable to keep an appointment. What may interact with this medicine? -medicines to increase blood counts like filgrastim, pegfilgrastim, sargramostim -probenecid -some antibiotics like amikacin, gentamicin, neomycin, polymyxin B, streptomycin, tobramycin -zalcitabine Talk to your doctor or health care professional before taking any of these medicines: -acetaminophen -aspirin -ibuprofen -ketoprofen -naproxen This list may not describe all possible interactions. Give your health care provider a list of all the medicines, herbs, non-prescription drugs, or dietary supplements you use. Also tell them if you smoke, drink alcohol, or use illegal drugs. Some items may interact with your medicine. What should I watch for while using this medicine? Your condition will be monitored carefully while you are receiving this medicine. You will need important blood work done while you are taking this medicine. This medicine can make you more sensitive to cold. Do not drink cold drinks or use ice. Cover exposed skin before coming in contact with cold temperatures or cold objects. When out in cold weather wear warm clothing and cover your mouth and nose to warm the air that goes into your lungs. Tell your doctor if you get sensitive to the cold. This drug  may make you feel generally unwell. This is not uncommon, as chemotherapy can affect healthy cells as well as cancer cells. Report any side effects. Continue your course of treatment even though you feel ill  unless your doctor tells you to stop. In some cases, you may be given additional medicines to help with side effects. Follow all directions for their use. Call your doctor or health care professional for advice if you get a fever, chills or sore throat, or other symptoms of a cold or flu. Do not treat yourself. This drug decreases your body's ability to fight infections. Try to avoid being around people who are sick. This medicine may increase your risk to bruise or bleed. Call your doctor or health care professional if you notice any unusual bleeding. Be careful brushing and flossing your teeth or using a toothpick because you may get an infection or bleed more easily. If you have any dental work done, tell your dentist you are receiving this medicine. Avoid taking products that contain aspirin, acetaminophen, ibuprofen, naproxen, or ketoprofen unless instructed by your doctor. These medicines may hide a fever. Do not become pregnant while taking this medicine. Women should inform their doctor if they wish to become pregnant or think they might be pregnant. There is a potential for serious side effects to an unborn child. Talk to your health care professional or pharmacist for more information. Do not breast-feed an infant while taking this medicine. Call your doctor or health care professional if you get diarrhea. Do not treat yourself. What side effects may I notice from receiving this medicine? Side effects that you should report to your doctor or health care professional as soon as possible: -allergic reactions like skin rash, itching or hives, swelling of the face, lips, or tongue -low blood counts - This drug may decrease the number of white blood cells, red blood cells and platelets. You may be at increased risk for infections and bleeding. -signs of infection - fever or chills, cough, sore throat, pain or difficulty passing urine -signs of decreased platelets or bleeding - bruising, pinpoint  red spots on the skin, black, tarry stools, nosebleeds -signs of decreased red blood cells - unusually weak or tired, fainting spells, lightheadedness -breathing problems -chest pain, pressure -cough -diarrhea -jaw tightness -mouth sores -nausea and vomiting -pain, swelling, redness or irritation at the injection site -pain, tingling, numbness in the hands or feet -problems with balance, talking, walking -redness, blistering, peeling or loosening of the skin, including inside the mouth -trouble passing urine or change in the amount of urine Side effects that usually do not require medical attention (report to your doctor or health care professional if they continue or are bothersome): -changes in vision -constipation -hair loss -loss of appetite -metallic taste in the mouth or changes in taste -stomach pain This list may not describe all possible side effects. Call your doctor for medical advice about side effects. You may report side effects to FDA at 1-800-FDA-1088. Where should I keep my medicine? This drug is given in a hospital or clinic and will not be stored at home. NOTE: This sheet is a summary. It may not cover all possible information. If you have questions about this medicine, talk to your doctor, pharmacist, or health care provider.  2018 Elsevier/Gold Standard (2008-05-21 17:22:47)

## 2018-10-10 ENCOUNTER — Telehealth: Payer: Self-pay | Admitting: *Deleted

## 2018-10-10 NOTE — Progress Notes (Signed)
  Oncology Nurse Navigator Documentation  Called and left VM for Mr. And Mrs. Zepeda to offer support and to assess for navigation needs after initiation of chemo on 10/09/18.  Patient has my contact information for questions or concerns.

## 2018-10-10 NOTE — Telephone Encounter (Signed)
TCT patient to follow up with him after his 1 st time chemotherapy of Oxaliplatin on 10/09/18. No answer but was able to leave vm message for patient. Adivised he could call back with any questions or concerns @ [phone]

## 2018-10-19 ENCOUNTER — Other Ambulatory Visit: Payer: Self-pay | Admitting: Oncology

## 2018-10-19 DIAGNOSIS — C182 Malignant neoplasm of ascending colon: Secondary | ICD-10-CM

## 2018-10-24 ENCOUNTER — Other Ambulatory Visit: Payer: Self-pay | Admitting: *Deleted

## 2018-10-29 ENCOUNTER — Other Ambulatory Visit: Payer: Self-pay | Admitting: Oncology

## 2018-10-30 ENCOUNTER — Inpatient Hospital Stay (HOSPITAL_BASED_OUTPATIENT_CLINIC_OR_DEPARTMENT_OTHER): Payer: 59 | Admitting: Oncology

## 2018-10-30 ENCOUNTER — Telehealth: Payer: Self-pay

## 2018-10-30 ENCOUNTER — Inpatient Hospital Stay: Payer: 59

## 2018-10-30 VITALS — BP 100/86 | HR 51 | Temp 98.2°F | Resp 18 | Ht 72.0 in | Wt 186.6 lb

## 2018-10-30 DIAGNOSIS — C182 Malignant neoplasm of ascending colon: Secondary | ICD-10-CM | POA: Diagnosis not present

## 2018-10-30 DIAGNOSIS — Z5112 Encounter for antineoplastic immunotherapy: Secondary | ICD-10-CM

## 2018-10-30 DIAGNOSIS — D630 Anemia in neoplastic disease: Secondary | ICD-10-CM

## 2018-10-30 DIAGNOSIS — G4730 Sleep apnea, unspecified: Secondary | ICD-10-CM

## 2018-10-30 DIAGNOSIS — I499 Cardiac arrhythmia, unspecified: Secondary | ICD-10-CM | POA: Diagnosis not present

## 2018-10-30 DIAGNOSIS — Z9221 Personal history of antineoplastic chemotherapy: Secondary | ICD-10-CM

## 2018-10-30 LAB — CBC WITH DIFFERENTIAL (CANCER CENTER ONLY)
Abs Immature Granulocytes: 0.02 10*3/uL (ref 0.00–0.07)
Basophils Absolute: 0.1 10*3/uL (ref 0.0–0.1)
Basophils Relative: 1 %
Eosinophils Absolute: 0.4 10*3/uL (ref 0.0–0.5)
Eosinophils Relative: 9 %
HCT: 37.2 % — ABNORMAL LOW (ref 39.0–52.0)
Hemoglobin: 11.6 g/dL — ABNORMAL LOW (ref 13.0–17.0)
Immature Granulocytes: 0 %
Lymphocytes Relative: 21 %
Lymphs Abs: 1 10*3/uL (ref 0.7–4.0)
MCH: 25.7 pg — ABNORMAL LOW (ref 26.0–34.0)
MCHC: 31.2 g/dL (ref 30.0–36.0)
MCV: 82.5 fL (ref 80.0–100.0)
Monocytes Absolute: 0.4 10*3/uL (ref 0.1–1.0)
Monocytes Relative: 9 %
Neutro Abs: 2.8 10*3/uL (ref 1.7–7.7)
Neutrophils Relative %: 60 %
Platelet Count: 229 10*3/uL (ref 150–400)
RBC: 4.51 MIL/uL (ref 4.22–5.81)
WBC Count: 4.7 10*3/uL (ref 4.0–10.5)
nRBC: 0 % (ref 0.0–0.2)

## 2018-10-30 LAB — CMP (CANCER CENTER ONLY)
ALT: 29 U/L (ref 0–44)
AST: 25 U/L (ref 15–41)
Albumin: 3.8 g/dL (ref 3.5–5.0)
Alkaline Phosphatase: 57 U/L (ref 38–126)
Anion gap: 8 (ref 5–15)
BUN: 13 mg/dL (ref 8–23)
CO2: 25 mmol/L (ref 22–32)
Calcium: 9.1 mg/dL (ref 8.9–10.3)
Chloride: 107 mmol/L (ref 98–111)
Creatinine: 0.87 mg/dL (ref 0.61–1.24)
GFR, Est AFR Am: >60 mL/min (ref >60)
GFR, Estimated: >60 mL/min (ref >60)
Glucose, Bld: 142 mg/dL — ABNORMAL HIGH (ref 70–99)
Potassium: 3.8 mmol/L (ref 3.5–5.1)
Sodium: 140 mmol/L (ref 135–145)
Total Bilirubin: 0.4 mg/dL (ref 0.3–1.2)
Total Protein: 6.7 g/dL (ref 6.5–8.1)

## 2018-10-30 MED ORDER — DEXTROSE 5 % IV SOLN
Freq: Once | INTRAVENOUS | Status: AC
  Administered 2018-10-30: 11:00:00 via INTRAVENOUS
  Filled 2018-10-30: qty 250

## 2018-10-30 MED ORDER — DEXAMETHASONE SODIUM PHOSPHATE 10 MG/ML IJ SOLN
INTRAMUSCULAR | Status: AC
  Filled 2018-10-30: qty 1

## 2018-10-30 MED ORDER — PALONOSETRON HCL INJECTION 0.25 MG/5ML
0.2500 mg | Freq: Once | INTRAVENOUS | Status: AC
  Administered 2018-10-30: 0.25 mg via INTRAVENOUS

## 2018-10-30 MED ORDER — SODIUM CHLORIDE 0.9% FLUSH
10.0000 mL | INTRAVENOUS | Status: DC | PRN
  Filled 2018-10-30: qty 10

## 2018-10-30 MED ORDER — PALONOSETRON HCL INJECTION 0.25 MG/5ML
INTRAVENOUS | Status: AC
  Filled 2018-10-30: qty 5

## 2018-10-30 MED ORDER — OXALIPLATIN CHEMO INJECTION 100 MG/20ML
130.0000 mg/m2 | Freq: Once | INTRAVENOUS | Status: AC
  Administered 2018-10-30: 270 mg via INTRAVENOUS
  Filled 2018-10-30: qty 54

## 2018-10-30 MED ORDER — DEXAMETHASONE SODIUM PHOSPHATE 10 MG/ML IJ SOLN
10.0000 mg | Freq: Once | INTRAMUSCULAR | Status: AC
  Administered 2018-10-30: 10 mg via INTRAVENOUS

## 2018-10-30 NOTE — Progress Notes (Signed)
  [facility] OFFICE PROGRESS NOTE   Diagnosis: Colon cancer  INTERVAL HISTORY:   Tracy [name] returns for scheduled visit.  He completed cycle 1 CAPOX beginning 10/09/2018.  No nausea/vomiting or mouth sores following chemotherapy.  Cold sensitivity lasted for approximately 1 week.  No neuropathy symptoms at present.  No hand or foot pain.  He reports loose stool 3-4 times per day.  This is a chronic pattern.  No increased diarrhea following chemotherapy.  He reports no difficulty with IV access.  Objective:  Vital signs in last 24 hours:  Blood pressure 100/86, pulse (!) 51, temperature 98.2 F (36.8 C), temperature source Oral, resp. rate 18, height 6' (1.829 m), weight 186 lb 9.6 oz (84.6 kg), SpO2 95 %.    HEENT: No thrush or ulcers Resp: Lungs clear bilaterally Cardio: Regular rate and rhythm GI: No hepatomegaly, nontender Vascular: No leg edema  Skin: Palms without erythema    Lab Results:  Lab Results  Component Value Date   WBC 4.7 10/30/2018   HGB 11.6 (L) 10/30/2018   HCT 37.2 (L) 10/30/2018   MCV 82.5 10/30/2018   PLT 229 10/30/2018   NEUTROABS 2.8 10/30/2018    CMP  Lab Results  Component Value Date   NA 140 10/30/2018   K 3.8 10/30/2018   CL 107 10/30/2018   CO2 25 10/30/2018   GLUCOSE 142 (H) 10/30/2018   BUN 13 10/30/2018   CREATININE 0.87 10/30/2018   CALCIUM 9.1 10/30/2018   PROT 6.7 10/30/2018   ALBUMIN 3.8 10/30/2018   AST 25 10/30/2018   ALT 29 10/30/2018   ALKPHOS 57 10/30/2018   BILITOT 0.4 10/30/2018   GFRNONAA >60 10/30/2018   GFRAA >60 10/30/2018    Lab Results  Component Value Date   CEA1 1.74 10/02/2018     Medications: I have reviewed the patient's current medications.   Assessment/Plan:  1. Adenocarcinoma the cecum, stage IIb (T4aN0), status post a right colectomy 09/01/2018  0/14 lymph nodes positive, perineural invasion present, no macroscopic tumor perforation, no lymphovascular  invasion  MSI-stable, no loss of mismatch repair protein expression  Colonoscopy 08/08/2018- ulcerated partially obstructing mass at the "hepatic flexure "-biopsy revealed invasive well-differentiated adenocarcinoma, no loss of mismatch repair protein expression, 6 mm polyp in the proximal transverse colon-not removed  CTs 08/16/2018- cecal mass, no evidence of metastatic disease  Cycle 1 CAPOX 10/09/2018  Cycle 2 CAPOX 10/30/2018  2. Arrhythmia-status post permanent pacemaker placement 3. Microcytic anemia secondary to #1-improved 4. Sleep apnea  Disposition: Tracy [name] tolerated the first cycle of CAPOX well.  He will complete cycle 2 today.  He will return for an this visit in the next cycle of chemotherapy in 3 weeks.  He will contact us for increased diarrhea or new symptoms in the interim.  15 minutes were spent with the patient today.  The majority of the time was used for counseling and coordination of care.  Betsy Coder, MD  10/30/2018  10:34 AM

## 2018-10-30 NOTE — Telephone Encounter (Signed)
Per patient has no port.Printed avs and calender of upcoming appointment. Per 12/23 los. Patient requested 2/3 appointment to be scheduled.

## 2018-10-30 NOTE — Patient Instructions (Signed)
[facility] Discharge Instructions for Patients Receiving Chemotherapy  Today you received the following chemotherapy agents: Oxaliplatin  To help prevent nausea and vomiting after your treatment, we encourage you to take your nausea medication as directed.   If you develop nausea and vomiting that is not controlled by your nausea medication, call the clinic.   BELOW ARE SYMPTOMS THAT SHOULD BE REPORTED IMMEDIATELY:  *FEVER GREATER THAN 100.5 F  *CHILLS WITH OR WITHOUT FEVER  NAUSEA AND VOMITING THAT IS NOT CONTROLLED WITH YOUR NAUSEA MEDICATION  *UNUSUAL SHORTNESS OF BREATH  *UNUSUAL BRUISING OR BLEEDING  TENDERNESS IN MOUTH AND THROAT WITH OR WITHOUT PRESENCE OF ULCERS  *URINARY PROBLEMS  *BOWEL PROBLEMS  UNUSUAL RASH Items with * indicate a potential emergency and should be followed up as soon as possible.  Feel free to call the clinic should you have any questions or concerns. The clinic phone number is [phone].  Please show the CHEMO ALERT CARD at check-in to the Emergency Department and triage nurse.   

## 2018-11-09 ENCOUNTER — Other Ambulatory Visit: Payer: Self-pay | Admitting: Oncology

## 2018-11-09 DIAGNOSIS — C182 Malignant neoplasm of ascending colon: Secondary | ICD-10-CM

## 2018-11-14 NOTE — Progress Notes (Signed)
Electrophysiology Office Note Date: 11/15/2018  ID:  Tracy [name], DOB [birth year], MRN [id number]  PCP: Orpah Melter, MD Electrophysiologist: Rayann Heman  CC: Pacemaker follow-up  Tracy [name] is a 63 y.o. male seen today for Dr Rayann Heman.  He presents today for routine electrophysiology followup.  Since last being seen in our clinic, the patient reports doing reasonably well.  He has undergone colon resection and is currently taking oral chemotherapy which has resulted in neuropathy. His wife and daughter report some LE edema, the patient has not noticed.  He denies chest pain, palpitations, dyspnea, PND, orthopnea, nausea, vomiting, dizziness, syncope, weight gain, or early satiety.  Device History: MDT dual chamber PPM implanted 2004 for SSS; gen change 2012   Past Medical History:  Diagnosis Date  . Bradycardia 01/01/03   s/p PPM (MDT)  . Cancer [facility])    colon cancer  . Dyslipidemia   . Myopia   . OSA (obstructive sleep apnea) 07/21/2011   NPSG 2012:  AHI 88/hr with both obstructive and central events.  Auto 10/2013:  Optimal pressure 16cm.    . Presence of permanent cardiac pacemaker    due to node dysfunction  . Sleep apnea 9/12   severe  . Syncope 2008   dysautonomia   Past Surgical History:  Procedure Laterality Date  . CARDIAC CATHETERIZATION  2008   no CAD  . HERNIA REPAIR Left    inguinal hernia  . LAPAROSCOPIC PARTIAL COLECTOMY N/A 09/01/2018   Procedure: LAPAROSCOPIC ASSISTED PARTIAL COLECTOMY;  Surgeon: Coralie Keens, MD;  Location: [facility];  Service: General;  Laterality: N/A;  . MENISCUS REPAIR Left   . PACEMAKER PLACEMENT  01/01/03   generator change (MDT) by Dr Rayann Heman 2012  . PARTIAL COLECTOMY  09/01/2018    Current Outpatient Medications  Medication Sig Dispense Refill  . atorvastatin (LIPITOR) 10 MG tablet Take 1 tablet (10 mg total) by mouth daily. 30 tablet 2  . ibuprofen (ADVIL,MOTRIN) 200 MG tablet Take 400 mg by mouth every 8 (eight) hours as  needed (for pain.).    Marland Kitchen loperamide (IMODIUM) 2 MG capsule Take 2 mg by mouth daily.     . metoprolol succinate (TOPROL-XL) 50 MG 24 hr tablet TAKE 1 TABLET (50 MG TOTAL) BY MOUTH DAILY. (Patient taking differently: Take 50 mg by mouth daily. ) 90 tablet 3  . Multiple Vitamin (MULTIVITAMIN WITH MINERALS) TABS tablet Take 1 tablet by mouth daily.    Marland Kitchen PARoxetine (PAXIL) 20 MG tablet TAKE 1 TABLET BY MOUTH EVERY DAY 30 tablet 8  . prochlorperazine (COMPAZINE) 10 MG tablet Take 1 tablet (10 mg total) by mouth every 6 (six) hours as needed for nausea or vomiting. 60 tablet 1  . traMADol (ULTRAM) 50 MG tablet Take 1 tablet (50 mg total) by mouth every 6 (six) hours as needed for severe pain. 20 tablet 1  . [START ON 11/20/2018] XELODA 500 MG tablet TAKE 4 TABLETS BY MOUTH  EVERY MORNING AND 3 TABLETS EVERY EVENING AFTER FOOD  FOR 14 DAYS ON, THEN 7 DAYS OFF, EVERY 21 DAYS 98 tablet 0   No current facility-administered medications for this visit.     Allergies:   Patient has no known allergies.   Social History: Social History   Socioeconomic History  . Marital status: Married    Spouse name: Not on file  . Number of children: Not on file  . Years of education: Not on file  . Highest education level: Not on file  Occupational  History  . Occupation: Passenger transport manager [employer]    Employer: Electrical engineer  Social Needs  . Financial resource strain: Not on file  . Food insecurity:    Worry: Not on file    Inability: Not on file  . Transportation needs:    Medical: Not on file    Non-medical: Not on file  Tobacco Use  . Smoking status: Former Smoker    Years: 1.00    Types: Cigars    Last attempt to quit: 08/01/2018    Years since quitting: 0.2  . Smokeless tobacco: Never Used  . Tobacco comment: 3 cigars a week  Substance and Sexual Activity  . Alcohol use: Not Currently    Alcohol/week: 0.0 standard drinks    Comment: beer-- 1-2 daily Stopped drinking 02/2018  . Drug  use: No  . Sexual activity: Not on file  Lifestyle  . Physical activity:    Days per week: Not on file    Minutes per session: Not on file  . Stress: Not on file  Relationships  . Social connections:    Talks on phone: Not on file    Gets together: Not on file    Attends religious service: Not on file    Active member of club or organization: Not on file    Attends meetings of clubs or organizations: Not on file    Relationship status: Not on file  . Intimate partner violence:    Fear of current or ex partner: Not on file    Emotionally abused: Not on file    Physically abused: Not on file    Forced sexual activity: Not on file  Other Topics Concern  . Not on file  Social History Narrative  . Not on file    Family History: Family History  Problem Relation Age of Onset  . Heart disease Other      Review of Systems: All other systems reviewed and are otherwise negative except as noted above.   Physical Exam: VS:  BP 116/66   Pulse (!) 54   Ht 6' (1.829 m)   Wt 187 lb (84.8 kg)   BMI 25.36 kg/m  , BMI Body mass index is 25.36 kg/m.  GEN- The patient is well appearing, alert and oriented x 3 today.   HEENT: normocephalic, atraumatic; sclera clear, conjunctiva pink; hearing intact; oropharynx clear; neck supple  Lungs- Clear to ausculation bilaterally, normal work of breathing.  No wheezes, rales, rhonchi Heart- Regular rate and rhythm, no murmurs, rubs or gallops  GI- soft, non-tender, non-distended, bowel sounds present  Extremities- no clubbing, cyanosis, or edema  MS- no significant deformity or atrophy Skin- warm and dry, no rash or lesion; PPM pocket well healed Psych- euthymic mood, full affect Neuro- strength and sensation are intact  PPM Interrogation- reviewed in detail today,  See PACEART report  EKG:  EKG is not ordered today.  Recent Labs: 10/30/2018: ALT 29; BUN 13; Creatinine 0.87; Hemoglobin 11.6; Platelet Count 229; Potassium 3.8; Sodium 140     Wt Readings from Last 3 Encounters:  11/15/18 187 lb (84.8 kg)  10/30/18 186 lb 9.6 oz (84.6 kg)  09/28/18 183 lb 14.4 oz (83.4 kg)     Other studies Reviewed: Additional studies/ records that were reviewed today include: Dr Jackalyn Lombard office notes   Assessment and Plan:  1.  Symptomatic sinus bradycardia Normal PPM function See Pace Art report No changes today  2.  PVC's Asymptomatic  3.  OSA  Compliant with CPAP  4.  Dilated ascending aorta Noted on echo 09/2018 Discussed options with patient today Will obtain imaging in 6 months when chemo is completed  Current medicines are reviewed at length with the patient today.   The patient does not have concerns regarding his medicines.  The following changes were made today:  none  Labs/ tests ordered today include:  Orders Placed This Encounter  Procedures  . CUP PACEART INCLINIC DEVICE CHECK     Disposition:   Follow up with me in 6 months    Signed, Chanetta Marshall, NP 11/15/2018 10:09 AM  [place] [address] [phone] (office) [phone] (fax)

## 2018-11-15 ENCOUNTER — Ambulatory Visit (INDEPENDENT_AMBULATORY_CARE_PROVIDER_SITE_OTHER): Payer: 59 | Admitting: Nurse Practitioner

## 2018-11-15 VITALS — BP 116/66 | HR 54 | Ht 72.0 in | Wt 187.0 lb

## 2018-11-15 DIAGNOSIS — I77810 Thoracic aortic ectasia: Secondary | ICD-10-CM

## 2018-11-15 DIAGNOSIS — R001 Bradycardia, unspecified: Secondary | ICD-10-CM | POA: Diagnosis not present

## 2018-11-15 DIAGNOSIS — I493 Ventricular premature depolarization: Secondary | ICD-10-CM | POA: Diagnosis not present

## 2018-11-15 LAB — CUP PACEART INCLINIC DEVICE CHECK
Date Time Interrogation Session: 20200108085020
Implantable Lead Implant Date: 20040329
Implantable Lead Implant Date: 20040329
Implantable Lead Location: 753859
Implantable Lead Location: 753860
Implantable Lead Model: 4092
Implantable Lead Model: 4592
Implantable Pulse Generator Implant Date: 20120918

## 2018-11-15 NOTE — Patient Instructions (Signed)
Medication Instructions:  none If you need a refill on your cardiac medications before your next appointment, please call your pharmacy.   Lab work: none If you have labs (blood work) drawn today and your tests are completely normal, you will receive your results only by: Marland Kitchen MyChart Message (if you have MyChart) OR . A paper copy in the mail If you have any lab test that is abnormal or we need to change your treatment, we will call you to review the results.  Testing/Procedures: none  Follow-Up: At [facility], you and your health needs are our priority.  As part of our continuing mission to provide you with exceptional heart care, we have created designated Provider Care Teams.  These Care Teams include your primary Cardiologist (physician) and Advanced Practice Providers (APPs -  Physician Assistants and Nurse Practitioners) who all work together to provide you with the care you need, when you need it. You will need a follow up appointment in 6 months.  Please call our office 2 months in advance to schedule this appointment.  You may see Dr Rayann Heman or one of the following Advanced Practice Providers on your designated Care Team:   Chanetta Marshall, NP . Tommye Standard, PA-C  Any Other Special Instructions Will Be Listed Below (If Applicable). Remote monitoring is used to monitor your Pacemaker from home. This monitoring reduces the number of office visits required to check your device to one time per year. It allows [place] to keep an eye on the functioning of your device to ensure it is working properly. You are scheduled for a device check from home on 11/20/2018. You may send your transmission at any time that day. If you have a wireless device, the transmission will be sent automatically. After your physician reviews your transmission, you will receive a postcard with your next transmission date.

## 2018-11-18 ENCOUNTER — Other Ambulatory Visit: Payer: Self-pay | Admitting: Oncology

## 2018-11-20 ENCOUNTER — Inpatient Hospital Stay: Payer: 59 | Attending: Oncology

## 2018-11-20 ENCOUNTER — Inpatient Hospital Stay: Payer: 59

## 2018-11-20 VITALS — BP 112/65 | HR 56 | Temp 98.1°F | Resp 16 | Wt 187.5 lb

## 2018-11-20 DIAGNOSIS — C182 Malignant neoplasm of ascending colon: Secondary | ICD-10-CM

## 2018-11-20 DIAGNOSIS — Z5111 Encounter for antineoplastic chemotherapy: Secondary | ICD-10-CM | POA: Diagnosis not present

## 2018-11-20 LAB — CMP (CANCER CENTER ONLY)
ALT: 25 U/L (ref 0–44)
AST: 28 U/L (ref 15–41)
Albumin: 4 g/dL (ref 3.5–5.0)
Alkaline Phosphatase: 66 U/L (ref 38–126)
Anion gap: 10 (ref 5–15)
BUN: 23 mg/dL (ref 8–23)
CO2: 21 mmol/L — ABNORMAL LOW (ref 22–32)
Calcium: 8.9 mg/dL (ref 8.9–10.3)
Chloride: 106 mmol/L (ref 98–111)
Creatinine: 0.98 mg/dL (ref 0.61–1.24)
GFR, Est AFR Am: >60 mL/min (ref >60)
GFR, Estimated: >60 mL/min (ref >60)
Glucose, Bld: 135 mg/dL — ABNORMAL HIGH (ref 70–99)
Potassium: 4.2 mmol/L (ref 3.5–5.1)
Sodium: 137 mmol/L (ref 135–145)
Total Bilirubin: 0.5 mg/dL (ref 0.3–1.2)
Total Protein: 7 g/dL (ref 6.5–8.1)

## 2018-11-20 LAB — CBC WITH DIFFERENTIAL (CANCER CENTER ONLY)
Abs Immature Granulocytes: 0.02 10*3/uL (ref 0.00–0.07)
Basophils Absolute: 0.1 10*3/uL (ref 0.0–0.1)
Basophils Relative: 1 %
Eosinophils Absolute: 0.3 10*3/uL (ref 0.0–0.5)
Eosinophils Relative: 7 %
HCT: 39.3 % (ref 39.0–52.0)
Hemoglobin: 12.7 g/dL — ABNORMAL LOW (ref 13.0–17.0)
Immature Granulocytes: 0 %
Lymphocytes Relative: 27 %
Lymphs Abs: 1.3 10*3/uL (ref 0.7–4.0)
MCH: 28.2 pg (ref 26.0–34.0)
MCHC: 32.3 g/dL (ref 30.0–36.0)
MCV: 87.1 fL (ref 80.0–100.0)
Monocytes Absolute: 0.6 10*3/uL (ref 0.1–1.0)
Monocytes Relative: 13 %
Neutro Abs: 2.5 10*3/uL (ref 1.7–7.7)
Neutrophils Relative %: 52 %
Platelet Count: 181 10*3/uL (ref 150–400)
RBC: 4.51 MIL/uL (ref 4.22–5.81)
RDW: 30.9 % — ABNORMAL HIGH (ref 11.5–15.5)
WBC Count: 4.8 10*3/uL (ref 4.0–10.5)
nRBC: 0 % (ref 0.0–0.2)

## 2018-11-20 MED ORDER — OXALIPLATIN CHEMO INJECTION 100 MG/20ML
130.0000 mg/m2 | Freq: Once | INTRAVENOUS | Status: AC
  Administered 2018-11-20: 270 mg via INTRAVENOUS
  Filled 2018-11-20: qty 54

## 2018-11-20 MED ORDER — DEXAMETHASONE SODIUM PHOSPHATE 10 MG/ML IJ SOLN
10.0000 mg | Freq: Once | INTRAMUSCULAR | Status: AC
  Administered 2018-11-20: 10 mg via INTRAVENOUS

## 2018-11-20 MED ORDER — DEXAMETHASONE SODIUM PHOSPHATE 10 MG/ML IJ SOLN
INTRAMUSCULAR | Status: AC
  Filled 2018-11-20: qty 1

## 2018-11-20 MED ORDER — PALONOSETRON HCL INJECTION 0.25 MG/5ML
INTRAVENOUS | Status: AC
  Filled 2018-11-20: qty 5

## 2018-11-20 MED ORDER — DEXTROSE 5 % IV SOLN
Freq: Once | INTRAVENOUS | Status: AC
  Administered 2018-11-20: 10:00:00 via INTRAVENOUS
  Filled 2018-11-20: qty 250

## 2018-11-20 MED ORDER — PALONOSETRON HCL INJECTION 0.25 MG/5ML
0.2500 mg | Freq: Once | INTRAVENOUS | Status: AC
  Administered 2018-11-20: 0.25 mg via INTRAVENOUS

## 2018-11-20 NOTE — Patient Instructions (Signed)
[facility] Discharge Instructions for Patients Receiving Chemotherapy  Today you received the following chemotherapy agents: Oxaliplatin  To help prevent nausea and vomiting after your treatment, we encourage you to take your nausea medication as directed.   If you develop nausea and vomiting that is not controlled by your nausea medication, call the clinic.   BELOW ARE SYMPTOMS THAT SHOULD BE REPORTED IMMEDIATELY:  *FEVER GREATER THAN 100.5 F  *CHILLS WITH OR WITHOUT FEVER  NAUSEA AND VOMITING THAT IS NOT CONTROLLED WITH YOUR NAUSEA MEDICATION  *UNUSUAL SHORTNESS OF BREATH  *UNUSUAL BRUISING OR BLEEDING  TENDERNESS IN MOUTH AND THROAT WITH OR WITHOUT PRESENCE OF ULCERS  *URINARY PROBLEMS  *BOWEL PROBLEMS  UNUSUAL RASH Items with * indicate a potential emergency and should be followed up as soon as possible.  Feel free to call the clinic should you have any questions or concerns. The clinic phone number is [phone].  Please show the CHEMO ALERT CARD at check-in to the Emergency Department and triage nurse.   

## 2018-11-21 ENCOUNTER — Telehealth: Payer: Self-pay | Admitting: *Deleted

## 2018-11-21 NOTE — Telephone Encounter (Signed)
Called to follow up on status with cycle #3 CAPOX. He reports his hand/arm got very sore during the infusion and is starting to improve. Diarrhea/loose stool is at his baseline--has loose stool every time he goes to the bathroom. Asked him how many times/day and he says "I don't count". Does take Imodium w/relief as needed. No mouth sores or hand/foot symptoms. He has some occasional jaw pain and his cold sensitivity persists now up to the next treatment. He is able to button shirt and pick up objects without difficulty. No neuropathy symptoms in his feet/toes. Instructed him to call for any worsening in his symptoms.

## 2018-11-22 ENCOUNTER — Telehealth: Payer: Self-pay

## 2018-11-22 NOTE — Telephone Encounter (Signed)
Left message for patient to remind of missed remote transmission.  

## 2018-11-24 ENCOUNTER — Encounter: Payer: Self-pay | Admitting: Cardiology

## 2018-11-27 ENCOUNTER — Other Ambulatory Visit: Payer: Self-pay | Admitting: Oncology

## 2018-11-27 DIAGNOSIS — C182 Malignant neoplasm of ascending colon: Secondary | ICD-10-CM

## 2018-11-27 NOTE — Telephone Encounter (Signed)
Too early--will refill week of 1/27

## 2018-12-01 ENCOUNTER — Other Ambulatory Visit: Payer: Self-pay | Admitting: Internal Medicine

## 2018-12-08 ENCOUNTER — Other Ambulatory Visit: Payer: Self-pay | Admitting: *Deleted

## 2018-12-08 DIAGNOSIS — C182 Malignant neoplasm of ascending colon: Secondary | ICD-10-CM

## 2018-12-10 ENCOUNTER — Other Ambulatory Visit: Payer: Self-pay | Admitting: Oncology

## 2018-12-11 ENCOUNTER — Encounter: Payer: Self-pay | Admitting: Oncology

## 2018-12-11 ENCOUNTER — Inpatient Hospital Stay: Payer: 59

## 2018-12-11 ENCOUNTER — Inpatient Hospital Stay: Payer: 59 | Attending: Oncology

## 2018-12-11 ENCOUNTER — Inpatient Hospital Stay (HOSPITAL_BASED_OUTPATIENT_CLINIC_OR_DEPARTMENT_OTHER): Payer: 59 | Admitting: Oncology

## 2018-12-11 VITALS — BP 111/71 | HR 64 | Temp 98.6°F | Resp 18 | Ht 72.0 in | Wt 185.8 lb

## 2018-12-11 DIAGNOSIS — C182 Malignant neoplasm of ascending colon: Secondary | ICD-10-CM

## 2018-12-11 DIAGNOSIS — G4730 Sleep apnea, unspecified: Secondary | ICD-10-CM

## 2018-12-11 DIAGNOSIS — D630 Anemia in neoplastic disease: Secondary | ICD-10-CM | POA: Insufficient documentation

## 2018-12-11 DIAGNOSIS — D696 Thrombocytopenia, unspecified: Secondary | ICD-10-CM | POA: Insufficient documentation

## 2018-12-11 DIAGNOSIS — L271 Localized skin eruption due to drugs and medicaments taken internally: Secondary | ICD-10-CM | POA: Diagnosis not present

## 2018-12-11 DIAGNOSIS — I499 Cardiac arrhythmia, unspecified: Secondary | ICD-10-CM | POA: Diagnosis not present

## 2018-12-11 DIAGNOSIS — Z5111 Encounter for antineoplastic chemotherapy: Secondary | ICD-10-CM

## 2018-12-11 DIAGNOSIS — T451X5A Adverse effect of antineoplastic and immunosuppressive drugs, initial encounter: Secondary | ICD-10-CM

## 2018-12-11 LAB — CMP (CANCER CENTER ONLY)
ALT: 20 U/L (ref 0–44)
AST: 29 U/L (ref 15–41)
Albumin: 3.8 g/dL (ref 3.5–5.0)
Alkaline Phosphatase: 81 U/L (ref 38–126)
Anion gap: 7 (ref 5–15)
BUN: 13 mg/dL (ref 8–23)
CO2: 25 mmol/L (ref 22–32)
Calcium: 9.2 mg/dL (ref 8.9–10.3)
Chloride: 108 mmol/L (ref 98–111)
Creatinine: 0.81 mg/dL (ref 0.61–1.24)
GFR, Est AFR Am: >60 mL/min (ref >60)
GFR, Estimated: >60 mL/min (ref >60)
Glucose, Bld: 118 mg/dL — ABNORMAL HIGH (ref 70–99)
Potassium: 4.1 mmol/L (ref 3.5–5.1)
Sodium: 140 mmol/L (ref 135–145)
Total Bilirubin: 0.6 mg/dL (ref 0.3–1.2)
Total Protein: 6.8 g/dL (ref 6.5–8.1)

## 2018-12-11 LAB — CBC WITH DIFFERENTIAL (CANCER CENTER ONLY)
Abs Immature Granulocytes: 0.01 10*3/uL (ref 0.00–0.07)
Basophils Absolute: 0.1 10*3/uL (ref 0.0–0.1)
Basophils Relative: 1 %
Eosinophils Absolute: 0.2 10*3/uL (ref 0.0–0.5)
Eosinophils Relative: 4 %
HCT: 38.7 % — ABNORMAL LOW (ref 39.0–52.0)
Hemoglobin: 12.7 g/dL — ABNORMAL LOW (ref 13.0–17.0)
Immature Granulocytes: 0 %
Lymphocytes Relative: 23 %
Lymphs Abs: 1.1 10*3/uL (ref 0.7–4.0)
MCH: 30.1 pg (ref 26.0–34.0)
MCHC: 32.8 g/dL (ref 30.0–36.0)
MCV: 91.7 fL (ref 80.0–100.0)
Monocytes Absolute: 0.7 10*3/uL (ref 0.1–1.0)
Monocytes Relative: 14 %
Neutro Abs: 2.6 10*3/uL (ref 1.7–7.7)
Neutrophils Relative %: 58 %
Platelet Count: 120 10*3/uL — ABNORMAL LOW (ref 150–400)
RBC: 4.22 MIL/uL (ref 4.22–5.81)
RDW: 26.3 % — ABNORMAL HIGH (ref 11.5–15.5)
WBC Count: 4.5 10*3/uL (ref 4.0–10.5)
nRBC: 0 % (ref 0.0–0.2)

## 2018-12-11 MED ORDER — PALONOSETRON HCL INJECTION 0.25 MG/5ML
INTRAVENOUS | Status: AC
  Filled 2018-12-11: qty 5

## 2018-12-11 MED ORDER — DEXAMETHASONE SODIUM PHOSPHATE 10 MG/ML IJ SOLN
INTRAMUSCULAR | Status: AC
  Filled 2018-12-11: qty 1

## 2018-12-11 MED ORDER — PALONOSETRON HCL INJECTION 0.25 MG/5ML
0.2500 mg | Freq: Once | INTRAVENOUS | Status: AC
  Administered 2018-12-11: 0.25 mg via INTRAVENOUS

## 2018-12-11 MED ORDER — OXALIPLATIN CHEMO INJECTION 100 MG/20ML
130.0000 mg/m2 | Freq: Once | INTRAVENOUS | Status: AC
  Administered 2018-12-11: 270 mg via INTRAVENOUS
  Filled 2018-12-11: qty 40

## 2018-12-11 MED ORDER — DEXTROSE 5 % IV SOLN
Freq: Once | INTRAVENOUS | Status: AC
  Administered 2018-12-11: 13:00:00 via INTRAVENOUS
  Filled 2018-12-11: qty 250

## 2018-12-11 MED ORDER — DEXAMETHASONE SODIUM PHOSPHATE 10 MG/ML IJ SOLN
10.0000 mg | Freq: Once | INTRAMUSCULAR | Status: AC
  Administered 2018-12-11: 10 mg via INTRAVENOUS

## 2018-12-11 NOTE — Patient Instructions (Signed)
[facility] Discharge Instructions for Patients Receiving Chemotherapy  Today you received the following chemotherapy agents: Oxaliplatin  To help prevent nausea and vomiting after your treatment, we encourage you to take your nausea medication as directed.   If you develop nausea and vomiting that is not controlled by your nausea medication, call the clinic.   BELOW ARE SYMPTOMS THAT SHOULD BE REPORTED IMMEDIATELY:  *FEVER GREATER THAN 100.5 F  *CHILLS WITH OR WITHOUT FEVER  NAUSEA AND VOMITING THAT IS NOT CONTROLLED WITH YOUR NAUSEA MEDICATION  *UNUSUAL SHORTNESS OF BREATH  *UNUSUAL BRUISING OR BLEEDING  TENDERNESS IN MOUTH AND THROAT WITH OR WITHOUT PRESENCE OF ULCERS  *URINARY PROBLEMS  *BOWEL PROBLEMS  UNUSUAL RASH Items with * indicate a potential emergency and should be followed up as soon as possible.  Feel free to call the clinic should you have any questions or concerns. The clinic phone number is [phone].  Please show the CHEMO ALERT CARD at check-in to the Emergency Department and triage nurse.   

## 2018-12-11 NOTE — Progress Notes (Signed)
[name] OFFICE PROGRESS NOTE   Diagnosis: Colon cancer  INTERVAL HISTORY:   [name] returns as scheduled.  He completed 3 CAPOX beginning 11/20/2018.  He reports burning discomfort in the right arm immediately after receiving oxaliplatin.  The IV site was in the lower right arm.  He had jaw pain getting day 2.  The jaw pain and tingling in the extremities lasted until 12/09/2018.  No neuropathy symptoms at present.  No mouth sores, diarrhea, or hand/foot pain.  He has developed mild desquamation at the hands.  Objective:  Vital signs in last 24 hours:  Blood pressure 111/71, pulse 64, temperature 98.6 F (37 C), temperature source Oral, resp. rate 18, height 6' (1.829 m), weight 185 lb 12.8 oz (84.3 kg), SpO2 97 %.    HEENT: No thrush or ulcers Resp: Lungs clear bilaterally Cardio: Regular rate and rhythm GI: No hepatomegaly, nontender Vascular: No leg edema Neuro: Mild loss of vibratory sense at the fingertips bilaterally Skin: Dryness, skin thickening, and superficial flaking at the soles.  No erythema.  Mild erythema at the distal fingers with superficial desquamation, linear ulcer at the left thumb    Lab Results:  Lab Results  Component Value Date   WBC 4.5 12/11/2018   HGB 12.7 (L) 12/11/2018   HCT 38.7 (L) 12/11/2018   MCV 91.7 12/11/2018   PLT 120 (L) 12/11/2018   NEUTROABS 2.6 12/11/2018    CMP  Lab Results  Component Value Date   NA 140 12/11/2018   K 4.1 12/11/2018   CL 108 12/11/2018   CO2 25 12/11/2018   GLUCOSE 118 (H) 12/11/2018   BUN 13 12/11/2018   CREATININE 0.81 12/11/2018   CALCIUM 9.2 12/11/2018   PROT 6.8 12/11/2018   ALBUMIN 3.8 12/11/2018   AST 29 12/11/2018   ALT 20 12/11/2018   ALKPHOS 81 12/11/2018   BILITOT 0.6 12/11/2018   GFRNONAA >60 12/11/2018   GFRAA >60 12/11/2018    Lab Results  Component Value Date   CEA1 1.74 10/02/2018     Medications: I have reviewed the patient's current  medications.   Assessment/Plan: 1. Adenocarcinoma the cecum, stage IIb (T4aN0), status post a right colectomy 09/01/2018  0/14 lymph nodes positive, perineural invasion present, no macroscopic tumor perforation, no lymphovascular invasion  MSI-stable, no loss of mismatch repair protein expression  Colonoscopy 08/08/2018- ulcerated partially obstructing mass at the "hepatic flexure "-biopsy revealed invasive well-differentiated adenocarcinoma, no loss of mismatch repair protein expression, 6 mm polyp in the proximal transverse colon-not removed  CTs 08/16/2018- cecal mass, no evidence of metastatic disease  Cycle 1 CAPOX 10/09/2018  Cycle 2 CAPOX 10/30/2018  Cycle 3 CAPOX 11/20/2018  Cycle 4 CAPOX 12/11/2018  2. Arrhythmia-status post permanent pacemaker placement 3. Microcytic anemia secondary to #1-improved 4. Sleep apnea 5. Mild thrombocytopenia secondary to chemotherapy    Disposition: Mr. Phommachanh has completed 3 cycles of CAPOX.  He has tolerated the chemotherapy well.  He had mild oxaliplatin neuropathy symptoms following cycle 3.  He had burning discomfort throughout the right upper extremity with the oxaliplatin infusion.  We will dilute the oxaliplatin today and recommend a more proximal IV site.  The infusion time will be lengthened.  He has developed mild hand/foot syndrome.  He will discontinue Xeloda and contact us if he develops increased erythema or pain at the hands or feet.  He will return for a lab visit in 3 weeks and an office visit in 6 weeks.  Betsy Coder, MD  12/11/2018  11:48 AM

## 2018-12-13 ENCOUNTER — Telehealth: Payer: Self-pay | Admitting: Oncology

## 2018-12-13 NOTE — Telephone Encounter (Signed)
Scheduled appt per 2/03 los - pt is aware of appts added - wanted me to leave appts on home vmail.

## 2018-12-18 ENCOUNTER — Other Ambulatory Visit: Payer: Self-pay | Admitting: Oncology

## 2018-12-18 DIAGNOSIS — C182 Malignant neoplasm of ascending colon: Secondary | ICD-10-CM

## 2018-12-31 ENCOUNTER — Other Ambulatory Visit: Payer: Self-pay | Admitting: Oncology

## 2018-12-31 ENCOUNTER — Other Ambulatory Visit: Payer: Self-pay | Admitting: Internal Medicine

## 2019-01-01 ENCOUNTER — Inpatient Hospital Stay: Payer: 59

## 2019-01-01 DIAGNOSIS — C182 Malignant neoplasm of ascending colon: Secondary | ICD-10-CM

## 2019-01-01 LAB — CBC WITH DIFFERENTIAL (CANCER CENTER ONLY)
Abs Immature Granulocytes: 0.01 10*3/uL (ref 0.00–0.07)
Basophils Absolute: 0 10*3/uL (ref 0.0–0.1)
Basophils Relative: 1 %
Eosinophils Absolute: 0.2 10*3/uL (ref 0.0–0.5)
Eosinophils Relative: 5 %
HCT: 38.6 % — ABNORMAL LOW (ref 39.0–52.0)
Hemoglobin: 12.8 g/dL — ABNORMAL LOW (ref 13.0–17.0)
Immature Granulocytes: 0 %
Lymphocytes Relative: 29 %
Lymphs Abs: 1.2 10*3/uL (ref 0.7–4.0)
MCH: 31.8 pg (ref 26.0–34.0)
MCHC: 33.2 g/dL (ref 30.0–36.0)
MCV: 96 fL (ref 80.0–100.0)
Monocytes Absolute: 0.6 10*3/uL (ref 0.1–1.0)
Monocytes Relative: 15 %
Neutro Abs: 2.2 10*3/uL (ref 1.7–7.7)
Neutrophils Relative %: 50 %
Platelet Count: 121 10*3/uL — ABNORMAL LOW (ref 150–400)
RBC: 4.02 MIL/uL — ABNORMAL LOW (ref 4.22–5.81)
RDW: 22.4 % — ABNORMAL HIGH (ref 11.5–15.5)
WBC Count: 4.3 10*3/uL (ref 4.0–10.5)
nRBC: 0 % (ref 0.0–0.2)

## 2019-01-22 ENCOUNTER — Inpatient Hospital Stay: Payer: 59 | Attending: Oncology | Admitting: Oncology

## 2019-01-22 ENCOUNTER — Inpatient Hospital Stay: Payer: 59

## 2019-01-22 ENCOUNTER — Telehealth: Payer: Self-pay | Admitting: Oncology

## 2019-01-22 ENCOUNTER — Other Ambulatory Visit: Payer: Self-pay

## 2019-01-22 VITALS — BP 99/62 | HR 54 | Temp 97.5°F | Resp 18 | Ht 72.0 in | Wt 187.1 lb

## 2019-01-22 DIAGNOSIS — D630 Anemia in neoplastic disease: Secondary | ICD-10-CM | POA: Diagnosis not present

## 2019-01-22 DIAGNOSIS — C182 Malignant neoplasm of ascending colon: Secondary | ICD-10-CM

## 2019-01-22 DIAGNOSIS — D696 Thrombocytopenia, unspecified: Secondary | ICD-10-CM | POA: Diagnosis not present

## 2019-01-22 DIAGNOSIS — Z85038 Personal history of other malignant neoplasm of large intestine: Secondary | ICD-10-CM | POA: Insufficient documentation

## 2019-01-22 DIAGNOSIS — Z9221 Personal history of antineoplastic chemotherapy: Secondary | ICD-10-CM | POA: Diagnosis not present

## 2019-01-22 DIAGNOSIS — G4730 Sleep apnea, unspecified: Secondary | ICD-10-CM | POA: Diagnosis not present

## 2019-01-22 LAB — CBC WITH DIFFERENTIAL (CANCER CENTER ONLY)
Abs Immature Granulocytes: 0.02 10*3/uL (ref 0.00–0.07)
Basophils Absolute: 0.1 10*3/uL (ref 0.0–0.1)
Basophils Relative: 1 %
Eosinophils Absolute: 0.3 10*3/uL (ref 0.0–0.5)
Eosinophils Relative: 5 %
HCT: 43.3 % (ref 39.0–52.0)
Hemoglobin: 14.5 g/dL (ref 13.0–17.0)
Immature Granulocytes: 0 %
Lymphocytes Relative: 26 %
Lymphs Abs: 1.4 10*3/uL (ref 0.7–4.0)
MCH: 33.5 pg (ref 26.0–34.0)
MCHC: 33.5 g/dL (ref 30.0–36.0)
MCV: 100 fL (ref 80.0–100.0)
Monocytes Absolute: 0.5 10*3/uL (ref 0.1–1.0)
Monocytes Relative: 9 %
Neutro Abs: 3.2 10*3/uL (ref 1.7–7.7)
Neutrophils Relative %: 59 %
Platelet Count: 142 10*3/uL — ABNORMAL LOW (ref 150–400)
RBC: 4.33 MIL/uL (ref 4.22–5.81)
RDW: 17.1 % — ABNORMAL HIGH (ref 11.5–15.5)
WBC Count: 5.5 10*3/uL (ref 4.0–10.5)
nRBC: 0 % (ref 0.0–0.2)

## 2019-01-22 LAB — CEA (IN HOUSE-CHCC): CEA (CHCC-In House): 3.01 ng/mL (ref 0.00–5.00)

## 2019-01-22 NOTE — Telephone Encounter (Signed)
Scheduled appt per 3/16 los. ° °Printed calendar and avs. °

## 2019-01-22 NOTE — Progress Notes (Signed)
  [place] OFFICE PROGRESS NOTE   Diagnosis: Colon cancer  INTERVAL HISTORY:   Tracy [name] completed another cycle of CAPOX beginning 12/11/2018.  He feels well.  No neuropathy symptoms.  No nausea, diarrhea, or hand/foot pain.  Skin changes at the hands have improved.  Objective:  Vital signs in last 24 hours:  Blood pressure 99/62, pulse (!) 54, temperature (!) 97.5 F (36.4 C), temperature source Oral, resp. rate 18, height 6' (1.829 m), weight 187 lb 1.6 oz (84.9 kg), SpO2 99 %.    HEENT: No thrush or ulcers Lymphatics: No cervical, supraclavicular, axillary, or inguinal nodes Resp: Lungs clear bilaterally Cardio: Regular rate and rhythm GI: No hepatosplenomegaly, nontender, no mass Vascular: No leg edema  Skin: Mild dryness of the hands    Lab Results:  Lab Results  Component Value Date   WBC 5.5 01/22/2019   HGB 14.5 01/22/2019   HCT 43.3 01/22/2019   MCV 100.0 01/22/2019   PLT 142 (L) 01/22/2019   NEUTROABS 3.2 01/22/2019    CMP  Lab Results  Component Value Date   NA 140 12/11/2018   K 4.1 12/11/2018   CL 108 12/11/2018   CO2 25 12/11/2018   GLUCOSE 118 (H) 12/11/2018   BUN 13 12/11/2018   CREATININE 0.81 12/11/2018   CALCIUM 9.2 12/11/2018   PROT 6.8 12/11/2018   ALBUMIN 3.8 12/11/2018   AST 29 12/11/2018   ALT 20 12/11/2018   ALKPHOS 81 12/11/2018   BILITOT 0.6 12/11/2018   GFRNONAA >60 12/11/2018   GFRAA >60 12/11/2018    Lab Results  Component Value Date   CEA1 1.74 10/02/2018    Medications: I have reviewed the patient's current medications.   Assessment/Plan: 1. Adenocarcinoma the cecum, stage IIb (T4aN0), status post a right colectomy 09/01/2018  0/14 lymph nodes positive, perineural invasion present, no macroscopic tumor perforation, no lymphovascular invasion  MSI-stable, no loss of mismatch repair protein expression  Colonoscopy 08/08/2018- ulcerated partially obstructing mass at the "hepatic flexure "-biopsy  revealed invasive well-differentiated adenocarcinoma, no loss of mismatch repair protein expression, 6 mm polyp in the proximal transverse colon-not removed  CTs 08/16/2018- cecal mass, no evidence of metastatic disease  Cycle 1 CAPOX 10/09/2018  Cycle 2 CAPOX 10/30/2018  Cycle 3 CAPOX 11/20/2018  Cycle 4 CAPOX 12/11/2018  2. Arrhythmia-status post permanent pacemaker placement 3. Microcytic anemia secondary to #1-improved 4. Sleep apnea 5. Mild thrombocytopenia secondary to chemotherapy-improved  Disposition: Tracy [name] has completed adjuvant chemotherapy.  He appears well.  He is in clinical remission from colon cancer.  We will follow-up on the CEA from today.  He will return for an office visit and CEA in 6 months.  We will plan for restaging CTs in October of this year.  15 minutes were spent with the patient today.  The majority of the time was used for counseling and coordination of care.  Betsy Coder, MD  01/22/2019  12:48 PM

## 2019-01-23 ENCOUNTER — Other Ambulatory Visit: Payer: Self-pay | Admitting: *Deleted

## 2019-01-23 ENCOUNTER — Encounter: Payer: Self-pay | Admitting: *Deleted

## 2019-01-23 DIAGNOSIS — C182 Malignant neoplasm of ascending colon: Secondary | ICD-10-CM

## 2019-06-10 ENCOUNTER — Other Ambulatory Visit: Payer: Self-pay | Admitting: Internal Medicine

## 2019-07-09 ENCOUNTER — Other Ambulatory Visit: Payer: Self-pay | Admitting: Internal Medicine

## 2019-07-10 NOTE — Telephone Encounter (Signed)
Pt's pharmacy is requesting a refill on paroxetine. Would Dr. Allred like to refill this medication? Please address 

## 2019-07-13 ENCOUNTER — Other Ambulatory Visit: Payer: Self-pay

## 2019-07-13 MED ORDER — PAROXETINE HCL 20 MG PO TABS: 20.0000 mg | ORAL_TABLET | Freq: Every day | ORAL | 1 refills | Status: DC

## 2019-07-13 NOTE — Progress Notes (Signed)
Pt seen 11/2018.  Not due for f/u until 11/2019.

## 2019-07-23 ENCOUNTER — Other Ambulatory Visit: Payer: Self-pay

## 2019-07-23 ENCOUNTER — Inpatient Hospital Stay (HOSPITAL_BASED_OUTPATIENT_CLINIC_OR_DEPARTMENT_OTHER): Payer: 59 | Admitting: Oncology

## 2019-07-23 ENCOUNTER — Inpatient Hospital Stay: Payer: 59 | Attending: Oncology

## 2019-07-23 ENCOUNTER — Telehealth: Payer: Self-pay | Admitting: *Deleted

## 2019-07-23 VITALS — BP 115/70 | HR 61 | Temp 98.3°F | Resp 18 | Ht 72.0 in | Wt 202.2 lb

## 2019-07-23 DIAGNOSIS — C182 Malignant neoplasm of ascending colon: Secondary | ICD-10-CM

## 2019-07-23 DIAGNOSIS — Z950 Presence of cardiac pacemaker: Secondary | ICD-10-CM | POA: Insufficient documentation

## 2019-07-23 DIAGNOSIS — Z23 Encounter for immunization: Secondary | ICD-10-CM | POA: Diagnosis not present

## 2019-07-23 DIAGNOSIS — R5383 Other fatigue: Secondary | ICD-10-CM | POA: Insufficient documentation

## 2019-07-23 DIAGNOSIS — D509 Iron deficiency anemia, unspecified: Secondary | ICD-10-CM | POA: Diagnosis not present

## 2019-07-23 DIAGNOSIS — G4730 Sleep apnea, unspecified: Secondary | ICD-10-CM | POA: Insufficient documentation

## 2019-07-23 DIAGNOSIS — Z85038 Personal history of other malignant neoplasm of large intestine: Secondary | ICD-10-CM | POA: Diagnosis present

## 2019-07-23 LAB — BASIC METABOLIC PANEL - CANCER CENTER ONLY
Anion gap: 9 (ref 5–15)
BUN: 15 mg/dL (ref 8–23)
CO2: 26 mmol/L (ref 22–32)
Calcium: 9.2 mg/dL (ref 8.9–10.3)
Chloride: 103 mmol/L (ref 98–111)
Creatinine: 1.07 mg/dL (ref 0.61–1.24)
GFR, Est AFR Am: >60 mL/min (ref >60)
GFR, Estimated: >60 mL/min (ref >60)
Glucose, Bld: 139 mg/dL — ABNORMAL HIGH (ref 70–99)
Potassium: 4.2 mmol/L (ref 3.5–5.1)
Sodium: 138 mmol/L (ref 135–145)

## 2019-07-23 LAB — CBC WITH DIFFERENTIAL (CANCER CENTER ONLY)
Abs Immature Granulocytes: 0.13 10*3/uL — ABNORMAL HIGH (ref 0.00–0.07)
Basophils Absolute: 0.1 10*3/uL (ref 0.0–0.1)
Basophils Relative: 1 %
Eosinophils Absolute: 0.4 10*3/uL (ref 0.0–0.5)
Eosinophils Relative: 5 %
HCT: 43.7 % (ref 39.0–52.0)
Hemoglobin: 14.5 g/dL (ref 13.0–17.0)
Immature Granulocytes: 2 %
Lymphocytes Relative: 21 %
Lymphs Abs: 1.5 10*3/uL (ref 0.7–4.0)
MCH: 31.9 pg (ref 26.0–34.0)
MCHC: 33.2 g/dL (ref 30.0–36.0)
MCV: 96 fL (ref 80.0–100.0)
Monocytes Absolute: 0.4 10*3/uL (ref 0.1–1.0)
Monocytes Relative: 6 %
Neutro Abs: 4.8 10*3/uL (ref 1.7–7.7)
Neutrophils Relative %: 65 %
Platelet Count: 199 10*3/uL (ref 150–400)
RBC: 4.55 MIL/uL (ref 4.22–5.81)
RDW: 12.3 % (ref 11.5–15.5)
WBC Count: 7.3 10*3/uL (ref 4.0–10.5)
nRBC: 0 % (ref 0.0–0.2)

## 2019-07-23 LAB — CEA (IN HOUSE-CHCC): CEA (CHCC-In House): 2.25 ng/mL (ref 0.00–5.00)

## 2019-07-23 MED ORDER — INFLUENZA VAC SPLIT QUAD 0.5 ML IM SUSY
PREFILLED_SYRINGE | INTRAMUSCULAR | Status: AC
  Filled 2019-07-23: qty 0.5

## 2019-07-23 MED ORDER — INFLUENZA VAC SPLIT QUAD 0.5 ML IM SUSY
0.5000 mL | PREFILLED_SYRINGE | Freq: Once | INTRAMUSCULAR | Status: AC
  Administered 2019-07-23: 0.5 mL via INTRAMUSCULAR

## 2019-07-23 NOTE — Telephone Encounter (Signed)
Per Dr. Sherrill, called pt with normal CEA results. Pt verbalized understanding.  

## 2019-07-23 NOTE — Progress Notes (Signed)
  [place] OFFICE PROGRESS NOTE   Diagnosis: Colon cancer  INTERVAL HISTORY:   Mr. Mitton feels well.  Good appetite.  He reports malaise.  He has frequent bowel movements.  No neuropathy symptoms.  Objective:  Vital signs in last 24 hours:  Blood pressure 115/70, pulse 61, temperature 98.3 F (36.8 C), temperature source Temporal, resp. rate 18, height 6' (1.829 m), weight 202 lb 3.2 oz (91.7 kg), SpO2 99 %.    HEENT: Neck without mass Lymphatics: No cervical, supraclavicular, axillary, or inguinal nodes GI: No hepatosplenomegaly, no mass, nontender Vascular: The left lower leg is slightly larger than the right side, no edema    Lab Results:  Lab Results  Component Value Date   WBC 7.3 07/23/2019   HGB 14.5 07/23/2019   HCT 43.7 07/23/2019   MCV 96.0 07/23/2019   PLT 199 07/23/2019   NEUTROABS 4.8 07/23/2019    CMP  Lab Results  Component Value Date   NA 140 12/11/2018   K 4.1 12/11/2018   CL 108 12/11/2018   CO2 25 12/11/2018   GLUCOSE 118 (H) 12/11/2018   BUN 13 12/11/2018   CREATININE 0.81 12/11/2018   CALCIUM 9.2 12/11/2018   PROT 6.8 12/11/2018   ALBUMIN 3.8 12/11/2018   AST 29 12/11/2018   ALT 20 12/11/2018   ALKPHOS 81 12/11/2018   BILITOT 0.6 12/11/2018   GFRNONAA >60 12/11/2018   GFRAA >60 12/11/2018    Lab Results  Component Value Date   CEA1 2.25 07/23/2019     Medications: I have reviewed the patient's current medications.   Assessment/Plan: 1. Adenocarcinoma the cecum, stage IIb (T4aN0), status post a right colectomy 09/01/2018  0/14 lymph nodes positive, perineural invasion present, no macroscopic tumor perforation, no lymphovascular invasion  MSI-stable, no loss of mismatch repair protein expression  Colonoscopy 08/08/2018- ulcerated partially obstructing mass at the "hepatic flexure "-biopsy revealed invasive well-differentiated adenocarcinoma, no loss of mismatch repair protein expression, 6 mm polyp in the  proximal transverse colon-not removed  CTs 08/16/2018- cecal mass, no evidence of metastatic disease  Cycle 1 CAPOX 10/09/2018  Cycle 2 CAPOX 10/30/2018  Cycle 3 CAPOX 11/20/2018  Cycle 4 CAPOX 12/11/2018  2. Arrhythmia-status post permanent pacemaker placement 3. Microcytic anemia secondary to #1- resolved 4. Sleep apnea 5. Mild thrombocytopenia secondary to chemotherapy-resolved    Disposition: Tracy [name] is in clinical remission from colon cancer.  We will refer him to Dr. Paulita Fujita for a 1 year surveillance colonoscopy.  He received an influenza vaccine today. Ms. Eastham will be scheduled for surveillance CT scans in October.  He will return for an office visit and CEA in 6 months.  Betsy Coder, MD  07/23/2019  10:57 AM

## 2019-07-23 NOTE — Telephone Encounter (Signed)
-----   Message from Ladell Pier, MD sent at 07/23/2019 11:35 AM EDT ----- Please call patient, CEA is normal

## 2019-07-24 ENCOUNTER — Telehealth: Payer: Self-pay | Admitting: Oncology

## 2019-07-24 NOTE — Telephone Encounter (Signed)
Called and spoke with patient. Confirmed appt  °

## 2019-08-21 ENCOUNTER — Other Ambulatory Visit: Payer: Self-pay | Admitting: *Deleted

## 2019-08-21 DIAGNOSIS — C182 Malignant neoplasm of ascending colon: Secondary | ICD-10-CM

## 2019-08-22 ENCOUNTER — Encounter (HOSPITAL_COMMUNITY): Payer: Self-pay

## 2019-08-22 ENCOUNTER — Inpatient Hospital Stay: Payer: 59 | Attending: Oncology

## 2019-08-22 ENCOUNTER — Other Ambulatory Visit: Payer: Self-pay

## 2019-08-22 ENCOUNTER — Ambulatory Visit (HOSPITAL_COMMUNITY)
Admission: RE | Admit: 2019-08-22 | Discharge: 2019-08-22 | Disposition: A | Discharge status: Home / Self Care | Payer: 59 | Source: Ambulatory Visit | Attending: Oncology | Admitting: Oncology

## 2019-08-22 DIAGNOSIS — C182 Malignant neoplasm of ascending colon: Secondary | ICD-10-CM | POA: Diagnosis not present

## 2019-08-22 DIAGNOSIS — Z85038 Personal history of other malignant neoplasm of large intestine: Secondary | ICD-10-CM | POA: Diagnosis present

## 2019-08-22 LAB — CMP (CANCER CENTER ONLY)
ALT: 24 U/L (ref 0–44)
AST: 22 U/L (ref 15–41)
Albumin: 4.4 g/dL (ref 3.5–5.0)
Alkaline Phosphatase: 73 U/L (ref 38–126)
Anion gap: 11 (ref 5–15)
BUN: 14 mg/dL (ref 8–23)
CO2: 31 mmol/L (ref 22–32)
Calcium: 9.3 mg/dL (ref 8.9–10.3)
Chloride: 99 mmol/L (ref 98–111)
Creatinine: 0.94 mg/dL (ref 0.61–1.24)
GFR, Est AFR Am: >60 mL/min (ref >60)
GFR, Estimated: >60 mL/min (ref >60)
Glucose, Bld: 110 mg/dL — ABNORMAL HIGH (ref 70–99)
Potassium: 4 mmol/L (ref 3.5–5.1)
Sodium: 141 mmol/L (ref 135–145)
Total Bilirubin: 0.9 mg/dL (ref 0.3–1.2)
Total Protein: 7.6 g/dL (ref 6.5–8.1)

## 2019-08-22 MED ORDER — SODIUM CHLORIDE (PF) 0.9 % IJ SOLN
INTRAMUSCULAR | Status: AC
  Filled 2019-08-22: qty 50

## 2019-08-22 MED ORDER — IOHEXOL 300 MG/ML  SOLN
100.0000 mL | Freq: Once | INTRAMUSCULAR | Status: AC | PRN
  Administered 2019-08-22: 10:00:00 100 mL via INTRAVENOUS

## 2019-08-24 ENCOUNTER — Telehealth: Payer: Self-pay | Admitting: *Deleted

## 2019-08-24 NOTE — Telephone Encounter (Signed)
Notified patient of CT results that show no cancer. Instructed him to call PCP regarding follow up the ascending aortic aneurysm. Copy faxed to Dr. Orpah Melter. Inquired if he has heard Dr. Paulita Fujita with [facility] regarding appointment for his 1 year surveillance colonoscopy and he had not. Called [facility] and left message requesting patient be called with this referral that was placed on 07/23/2019.

## 2019-09-06 ENCOUNTER — Telehealth: Payer: Self-pay

## 2019-09-06 NOTE — Telephone Encounter (Signed)
Called patient to confirm that he had heard back from Dr. Erlinda Hong office about getting a colonoscopy scheduled. Per patient's wife, patient is scheduled to see Dr. Paulita Fujita on 09/18/19 for a consultation, and will get the actual procedure scheduled at that appointment. No other needs identified at this time. Patient and wife know to call clinic back if they have any questions/concerns.

## 2019-11-06 ENCOUNTER — Other Ambulatory Visit: Payer: Self-pay | Admitting: Internal Medicine

## 2019-12-04 ENCOUNTER — Other Ambulatory Visit: Payer: Self-pay | Admitting: Internal Medicine

## 2019-12-07 ENCOUNTER — Telehealth: Payer: Self-pay

## 2019-12-07 MED ORDER — ATORVASTATIN CALCIUM 10 MG PO TABS: 10.0000 mg | ORAL_TABLET | Freq: Every day | ORAL | 0 refills | Status: DC

## 2019-12-07 MED ORDER — PAROXETINE HCL 20 MG PO TABS: 20.0000 mg | ORAL_TABLET | Freq: Every day | ORAL | 0 refills | Status: DC

## 2019-12-07 MED ORDER — METOPROLOL SUCCINATE ER 50 MG PO TB24: 50.0000 mg | ORAL_TABLET | Freq: Every day | ORAL | 0 refills | Status: DC

## 2019-12-07 NOTE — Telephone Encounter (Signed)
Pt's Wife called requesting refills on 3 of his medications. I advised her that it was past time for his f/u appt. She stated that she would have him call back to get that scheduled. I told her I would send in 1 mth supply for his Metoprolol, Atorvastatin and Paroxetine but that he would have to schedule an appt for future refills.

## 2019-12-17 ENCOUNTER — Telehealth: Payer: Self-pay

## 2019-12-21 ENCOUNTER — Telehealth (INDEPENDENT_AMBULATORY_CARE_PROVIDER_SITE_OTHER): Payer: 59 | Admitting: Internal Medicine

## 2019-12-21 DIAGNOSIS — R001 Bradycardia, unspecified: Secondary | ICD-10-CM

## 2019-12-21 DIAGNOSIS — G4733 Obstructive sleep apnea (adult) (pediatric): Secondary | ICD-10-CM | POA: Diagnosis not present

## 2019-12-21 DIAGNOSIS — I77810 Thoracic aortic ectasia: Secondary | ICD-10-CM

## 2019-12-21 DIAGNOSIS — I493 Ventricular premature depolarization: Secondary | ICD-10-CM

## 2019-12-21 DIAGNOSIS — G901 Familial dysautonomia [Riley-Day]: Secondary | ICD-10-CM

## 2019-12-21 NOTE — Addendum Note (Signed)
Addended by: Stanton Kidney on: 12/21/2019 04:43 PM   Modules accepted: Orders

## 2019-12-21 NOTE — Progress Notes (Signed)
Electrophysiology TeleHealth Note   Due to national recommendations of social distancing due to COVID 19, an audio/video telehealth visit is felt to be most appropriate for this patient at this time.  See MyChart message from today for the patient's consent to telehealth for Tracy [name].  Date:  12/21/2019   ID:  Tracy [name], DOB 1955-12-17, MRN [id number]  Location: patient's home  Provider location:  [facility]  Evaluation Performed: Follow-up visit  PCP:  Orpah Melter, MD   Electrophysiologist:  Dr Rayann Heman  Chief Complaint:  Follow up  History of Present Illness:    Tracy [name] is a 64 y.o. male who presents via telehealth conferencing today.  Since last being seen in our clinic, the patient reports doing reasonably well.  Today, he denies symptoms of palpitations, chest pain, shortness of breath,  lower extremity edema, dizziness, presyncope, or syncope.  The patient is otherwise without complaint today.  The patient denies symptoms of fevers, chills, cough, or new SOB worrisome for COVID 19.  Past Medical History:  Diagnosis Date  . Bradycardia 01/01/03   s/p PPM (MDT)  . Cancer [facility] [name])    colon cancer  . Dyslipidemia   . Myopia   . OSA (obstructive sleep apnea) 07/21/2011   NPSG 2012:  AHI 88/hr with both obstructive and central events.  Auto 10/2013:  Optimal pressure 16cm.    . Presence of permanent cardiac pacemaker    due to node dysfunction  . Sleep apnea 9/12   severe  . Syncope 2008   dysautonomia    Past Surgical History:  Procedure Laterality Date  . CARDIAC CATHETERIZATION  2008   no CAD  . HERNIA REPAIR Left    inguinal hernia  . LAPAROSCOPIC PARTIAL COLECTOMY N/A 09/01/2018   Procedure: LAPAROSCOPIC ASSISTED PARTIAL COLECTOMY;  Surgeon: Tracy Keens, MD;  Location: [place];  Service: General;  Laterality: N/A;  . MENISCUS REPAIR Left   . PACEMAKER PLACEMENT  01/01/03   generator change (MDT) by Dr Rayann Heman 2012  . PARTIAL COLECTOMY   09/01/2018    Current Outpatient Medications  Medication Sig Dispense Refill  . atorvastatin (LIPITOR) 10 MG tablet Take 1 tablet (10 mg total) by mouth daily. 30 tablet 0  . ibuprofen (ADVIL,MOTRIN) 200 MG tablet Take 400 mg by mouth every 8 (eight) hours as needed (for pain.).    Tracy [name] loperamide (IMODIUM) 2 MG capsule Take 2 mg by mouth daily.     . metoprolol succinate (TOPROL-XL) 50 MG 24 hr tablet Take 1 tablet (50 mg total) by mouth daily. 30 tablet 0  . PARoxetine (PAXIL) 20 MG tablet Take 1 tablet (20 mg total) by mouth daily. 30 tablet 0   No current facility-administered medications for this visit.    Allergies:   Patient has no known allergies.   Social History:  The patient  reports that he quit smoking about 16 months ago. His smoking use included cigars. He quit after 1.00 year of use. He has never used smokeless tobacco. He reports previous alcohol use. He reports that he does not use drugs.   Family History:  The patient's  family history includes Heart disease in an other family member.   ROS:  Please see the history of present illness.   All other systems are personally reviewed and negative.    Exam:    Vital Signs:  There were no vitals taken for this visit.  Well sounding and appearing, alert and conversant, regular work of breathing,  good skin color Eyes- anicteric, neuro- grossly intact, skin- no apparent rash or lesions or cyanosis, mouth- oral mucosa is pink  Labs/Other Tests and Data Reviewed:    Recent Labs: 07/23/2019: Hemoglobin 14.5; Platelet Count 199 08/22/2019: ALT 24; BUN 14; Creatinine 0.94; Potassium 4.0; Sodium 141   Wt Readings from Last 3 Encounters:  07/23/19 202 lb 3.2 oz (91.7 kg)  01/22/19 187 lb 1.6 oz (84.9 kg)  12/11/18 185 lb 12.8 oz (84.3 kg)     Last device remote is reviewed from [facility] [employer] which reveals normal device function   ASSESSMENT & PLAN:    1.  Symptomatic sinus bradycardia Normal pacemaker function by last  remote See [employer] report Remote requested today to get back on schedule   2.  PVCs Asymptomatic   3.  Dysautonomia Continue Paxil - ok to refill under my name  4.  OSA Compliant with CPAP  5. Dilated ascending aorta CT from October reviewed Will repeat imaging in November (echo and CT)   Recommend COVID-19 vaccine when it's available  Follow-up:  Remotes, me in 1 year    Patient Risk:  after full review of this patients clinical status, I feel that they are at moderate risk at this time.  Today, I have spent 15 minutes with the patient with telehealth technology discussing arrhythmia management .    Army Fossa, MD  12/21/2019 12:14 PM     Tracy [name] [id number] [phone] (office) [phone] (fax)

## 2019-12-26 ENCOUNTER — Ambulatory Visit (INDEPENDENT_AMBULATORY_CARE_PROVIDER_SITE_OTHER): Payer: 59 | Admitting: *Deleted

## 2019-12-26 DIAGNOSIS — R001 Bradycardia, unspecified: Secondary | ICD-10-CM | POA: Diagnosis not present

## 2019-12-26 LAB — CUP PACEART REMOTE DEVICE CHECK
Battery Impedance: 822 Ohm
Battery Remaining Longevity: 78 mo
Battery Voltage: 2.78 V
Brady Statistic AP VP Percent: 26 %
Brady Statistic AP VS Percent: 1 %
Brady Statistic AS VP Percent: 0 %
Brady Statistic AS VS Percent: 74 %
Date Time Interrogation Session: 20210217120700
Implantable Lead Implant Date: 20040329
Implantable Lead Implant Date: 20040329
Implantable Lead Location: 753859
Implantable Lead Location: 753860
Implantable Lead Model: 4092
Implantable Lead Model: 4592
Implantable Pulse Generator Implant Date: 20120918
Lead Channel Impedance Value: 632 Ohm
Lead Channel Impedance Value: 751 Ohm
Lead Channel Pacing Threshold Amplitude: 1.125 V
Lead Channel Pacing Threshold Pulse Width: 0.4 ms
Lead Channel Setting Pacing Amplitude: 2 V
Lead Channel Setting Pacing Amplitude: 2.5 V
Lead Channel Setting Pacing Pulse Width: 0.4 ms
Lead Channel Setting Sensing Sensitivity: 2.8 mV

## 2019-12-26 NOTE — Progress Notes (Signed)
PPM Remote  

## 2020-01-06 ENCOUNTER — Other Ambulatory Visit: Payer: Self-pay | Admitting: Internal Medicine

## 2020-01-21 ENCOUNTER — Inpatient Hospital Stay: Payer: 59

## 2020-01-21 ENCOUNTER — Inpatient Hospital Stay: Payer: 59 | Admitting: Nurse Practitioner

## 2020-01-21 ENCOUNTER — Telehealth: Payer: Self-pay | Admitting: Oncology

## 2020-01-21 NOTE — Telephone Encounter (Signed)
Called and spoke with patients wife. Rescheduled 3/15 appt to 3/25 per patients requst.

## 2020-01-31 ENCOUNTER — Inpatient Hospital Stay (HOSPITAL_BASED_OUTPATIENT_CLINIC_OR_DEPARTMENT_OTHER): Payer: 59 | Admitting: Nurse Practitioner

## 2020-01-31 ENCOUNTER — Inpatient Hospital Stay: Payer: 59 | Attending: Nurse Practitioner

## 2020-01-31 ENCOUNTER — Encounter: Payer: Self-pay | Admitting: Nurse Practitioner

## 2020-01-31 ENCOUNTER — Other Ambulatory Visit: Payer: Self-pay

## 2020-01-31 VITALS — BP 108/68 | HR 61 | Temp 98.5°F | Resp 17 | Ht 72.0 in | Wt 207.7 lb

## 2020-01-31 DIAGNOSIS — Z9221 Personal history of antineoplastic chemotherapy: Secondary | ICD-10-CM | POA: Insufficient documentation

## 2020-01-31 DIAGNOSIS — C182 Malignant neoplasm of ascending colon: Secondary | ICD-10-CM

## 2020-01-31 DIAGNOSIS — Z85038 Personal history of other malignant neoplasm of large intestine: Secondary | ICD-10-CM | POA: Insufficient documentation

## 2020-01-31 DIAGNOSIS — G4730 Sleep apnea, unspecified: Secondary | ICD-10-CM | POA: Diagnosis not present

## 2020-01-31 LAB — CEA (IN HOUSE-CHCC): CEA (CHCC-In House): 1.89 ng/mL (ref 0.00–5.00)

## 2020-01-31 NOTE — Progress Notes (Signed)
  [place] OFFICE PROGRESS NOTE   Diagnosis: Colon cancer  INTERVAL HISTORY:   Tracy [name] returns as scheduled.  No change in bowel habits.  He takes Imodium 1 time a day.  No blood or pain with bowel movements.  He has a good appetite.  No numbness or tingling in the hands or feet.  Objective:  Vital signs in last 24 hours:  Blood pressure 108/68, pulse 61, temperature 98.5 F (36.9 C), temperature source Temporal, resp. rate 17, height 6' (1.829 m), weight 207 lb 11.2 oz (94.2 kg), SpO2 97 %.    HEENT: Neck without mass. Lymphatics: No palpable cervical, supraclavicular, axillary or inguinal lymph nodes. Resp: Lungs clear bilaterally. Cardio: Regular rate and rhythm. GI: Abdomen soft and nontender.  No hepatomegaly.  No mass. Vascular: No leg edema. Skin: Palms without erythema.   Lab Results:  Lab Results  Component Value Date   WBC 7.3 07/23/2019   HGB 14.5 07/23/2019   HCT 43.7 07/23/2019   MCV 96.0 07/23/2019   PLT 199 07/23/2019   NEUTROABS 4.8 07/23/2019    Imaging:  No results found.  Medications: I have reviewed the patient's current medications.  Assessment/Plan: 1. Adenocarcinoma the cecum, stage IIb (T4aN0), status post a right colectomy 09/01/2018 ? 0/14 lymph nodes positive, perineural invasion present, no macroscopic tumor perforation, no lymphovascular invasion ? MSI-stable, no loss of mismatch repair protein expression ? Colonoscopy 08/08/2018- ulcerated partially obstructing mass at the "hepatic flexure "-biopsy revealed invasive well-differentiated adenocarcinoma, no loss of mismatch repair protein expression, 6 mm polyp in the proximal transverse colon-not removed ? CTs 08/16/2018- cecal mass, no evidence of metastatic disease ? Cycle 1 CAPOX 10/09/2018 ? Cycle 2 CAPOX 10/30/2018 ? Cycle 3 CAPOX 11/20/2018 ? Cycle 4 CAPOX 12/11/2018 ? Surveillance CT scans 08/22/2019-no definitive evidence of metastatic disease.  Stable 4 mm  lingular nodule. ? Surveillance colonoscopy November 2020-report requested 01/31/2020  2. Arrhythmia-status post permanent pacemaker placement 3. Microcytic anemia secondary to #1- resolved 4. Sleep apnea 5. Mild thrombocytopenia secondary to chemotherapy-resolved  Disposition: Tracy [name] remains in clinical remission from colon cancer.  We will follow-up on the CEA from today.  We are referring him for surveillance CT scans early November 2021, will try to coordinate with CT angio chest as ordered by Dr. Rayann Heman for 09/16/2020.  We will contact Dr. Erlinda Hong office for the colonoscopy report from November 2020.  Tracy [name] will return for a CEA and follow-up visit in 6 months.  He will contact the office in the interim with any problems.  Plan reviewed with Dr. Benay Spice.      Tracy [name] ANP/GNP-BC   01/31/2020  10:47 AM

## 2020-02-01 ENCOUNTER — Telehealth: Payer: Self-pay

## 2020-02-01 ENCOUNTER — Telehealth: Payer: Self-pay | Admitting: Oncology

## 2020-02-01 NOTE — Telephone Encounter (Signed)
Scheduled per los. Called and left msg. Mailed printout  °

## 2020-02-01 NOTE — Telephone Encounter (Signed)
Called and left a message letting patient know CEA is stable and in the normal range per Ned Card, NP. Instructed patient to call [phone] with any questions or concerns.

## 2020-02-01 NOTE — Telephone Encounter (Signed)
-----   Message from Owens Shark, NP sent at 01/31/2020  4:55 PM EDT ----- Please let his wife know the CEA from today is stable in the normal range.  Follow-up as scheduled.

## 2020-03-21 IMAGING — CT CT ABD-PELV W/ CM
2 of 5 series · 12 of 36 positions shown, 15 images · IV contrast (OMNIPAQUE)
Comparison: 08/16/2018.

CLINICAL DATA: Restaging colon cancer.

EXAM:
CT CHEST, ABDOMEN, AND PELVIS WITH CONTRAST
TECHNIQUE: Multidetector CT imaging of the chest, abdomen and pelvis was
performed following the standard protocol during bolus
administration of intravenous contrast.
CONTRAST:  100mL OMNIPAQUE IOHEXOL 300 MG/ML  SOLN

[Series 2: cap with · axial · 0.77mm/px · z∈[-671,-176]mm · 9 of 125 slices shown, 12 images]
[im 13/125  mediastinal]
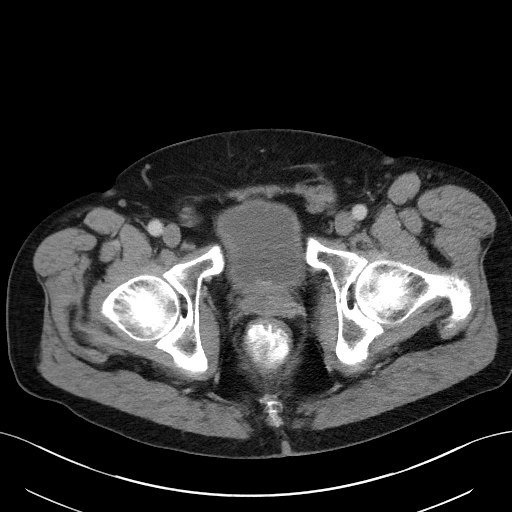
[im 13/125  lung]
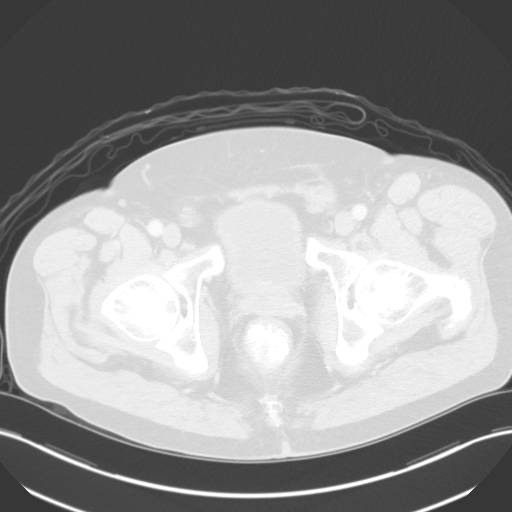
[im 25/125  lung]
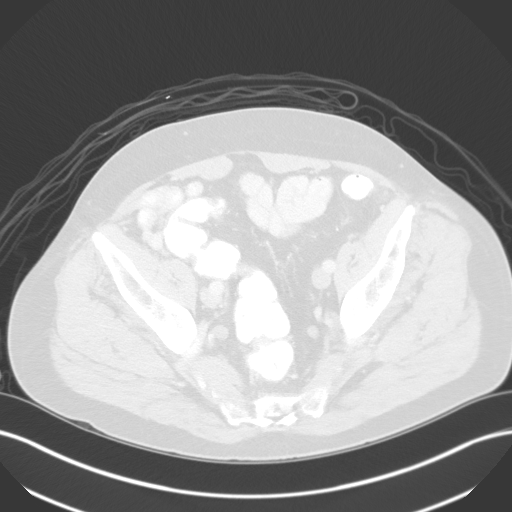
[im 38/125  lung]
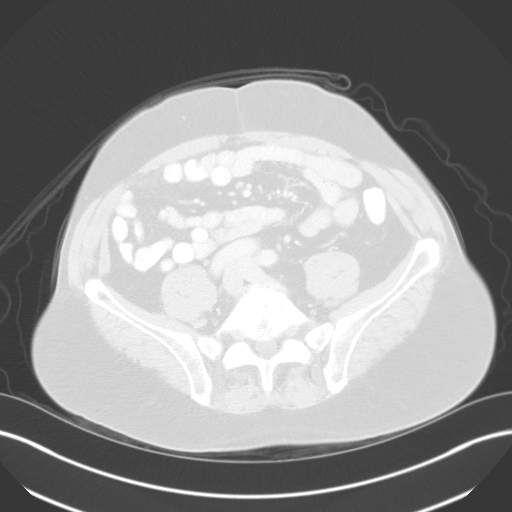
[im 50/125  lung]
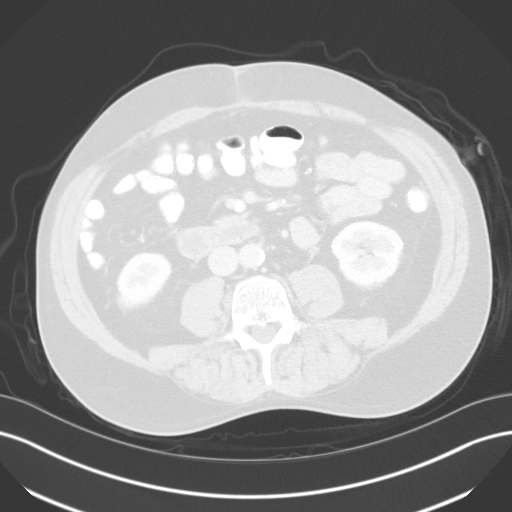
[im 63/125  mediastinal]
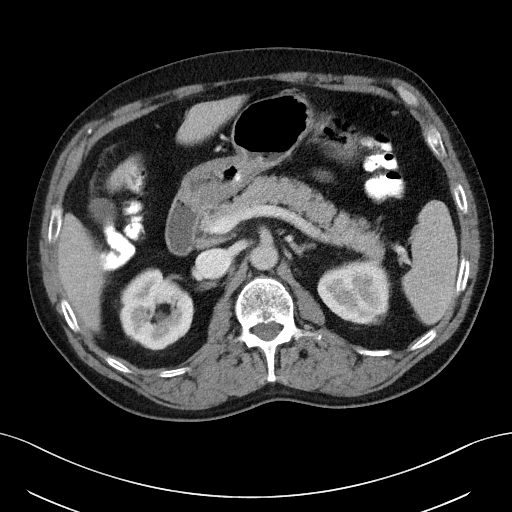
[im 63/125  lung]
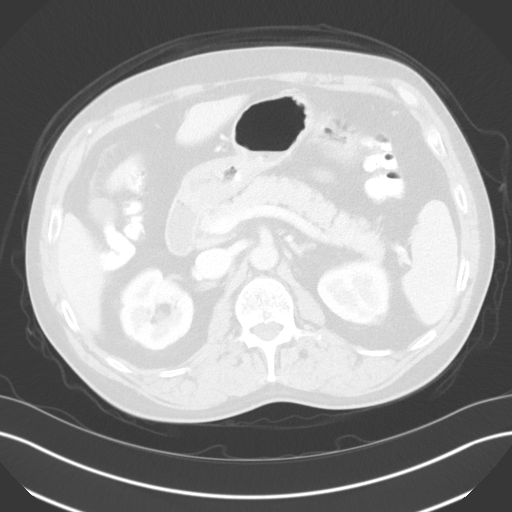
[im 75/125  lung]
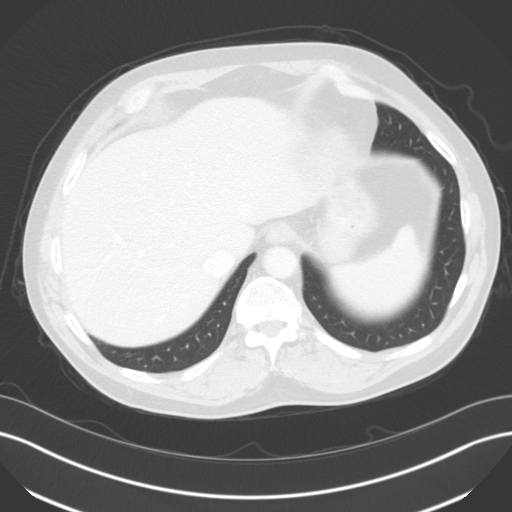
[im 87/125  lung]
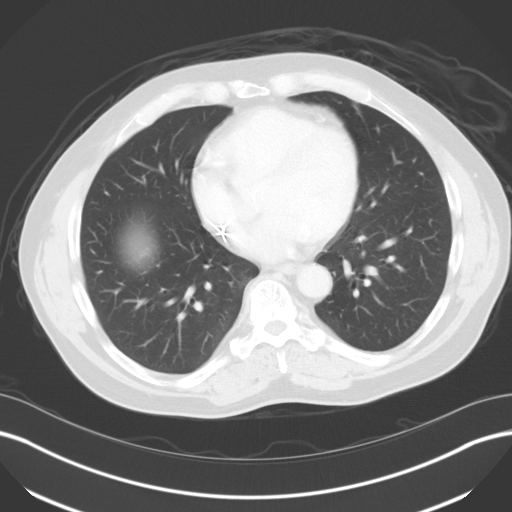
[im 100/125  lung]
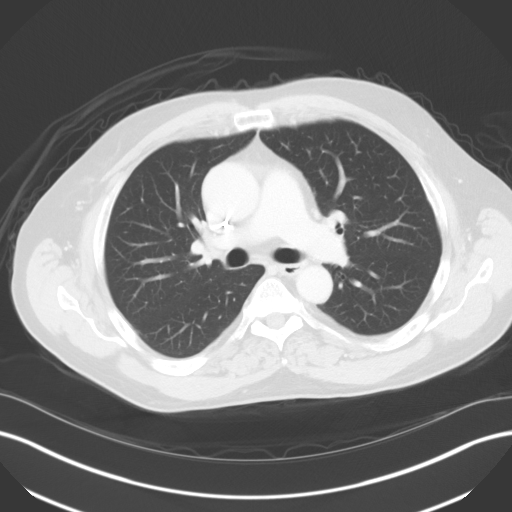
[im 112/125  mediastinal]
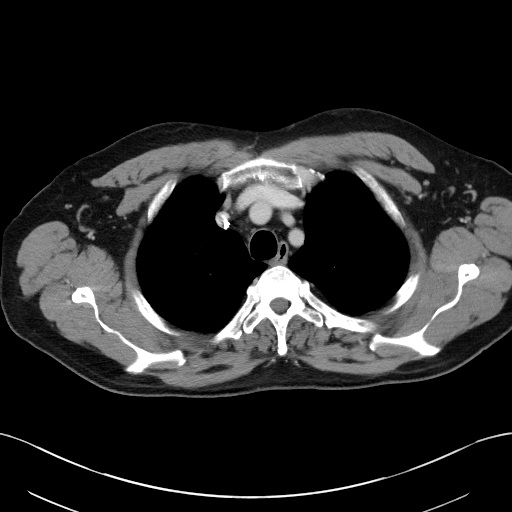
[im 112/125  lung]
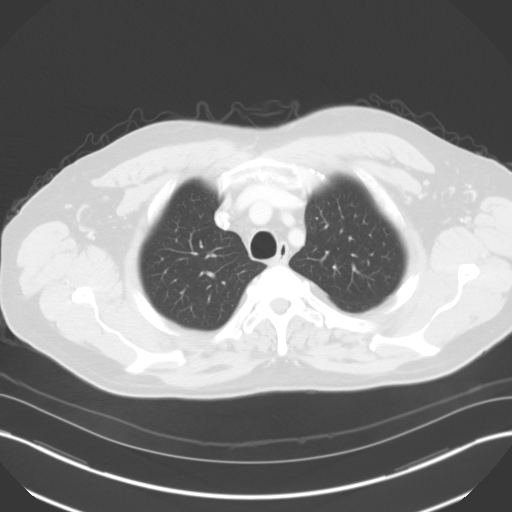

[Series 6: coronals · coronal · 0.79mm/px · 3 of 141 slices shown]
[im 29/141  lung]
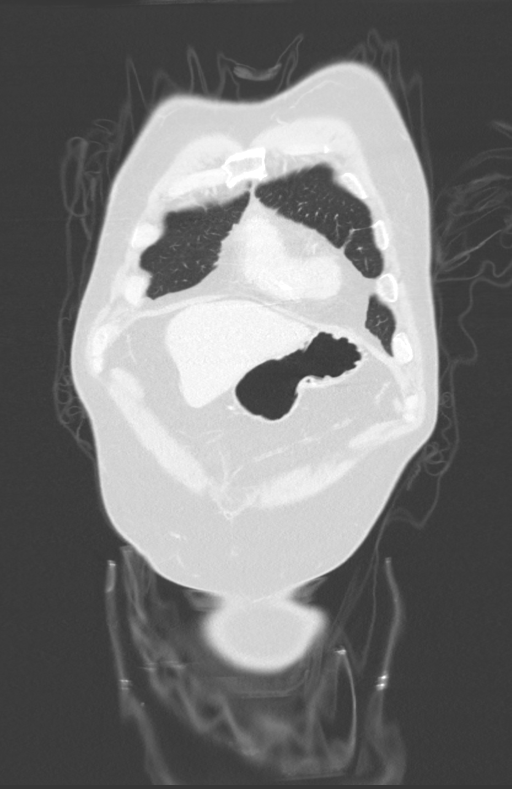
[im 57/141  lung]
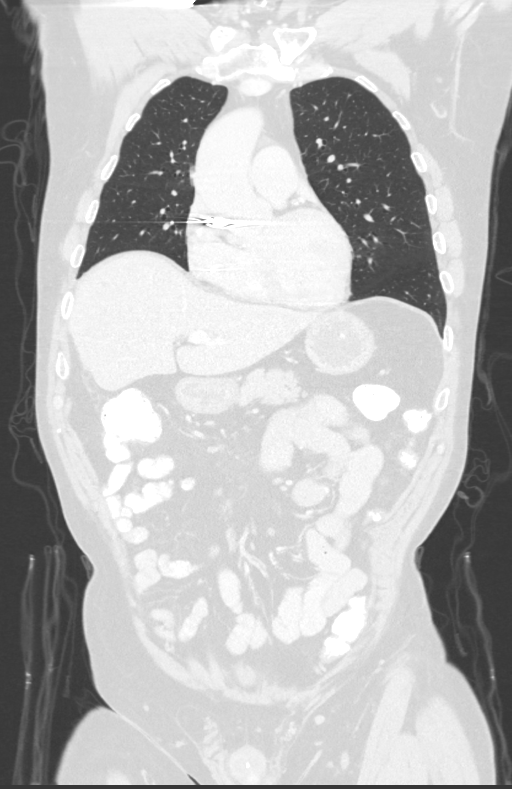
[im 85/141  lung]
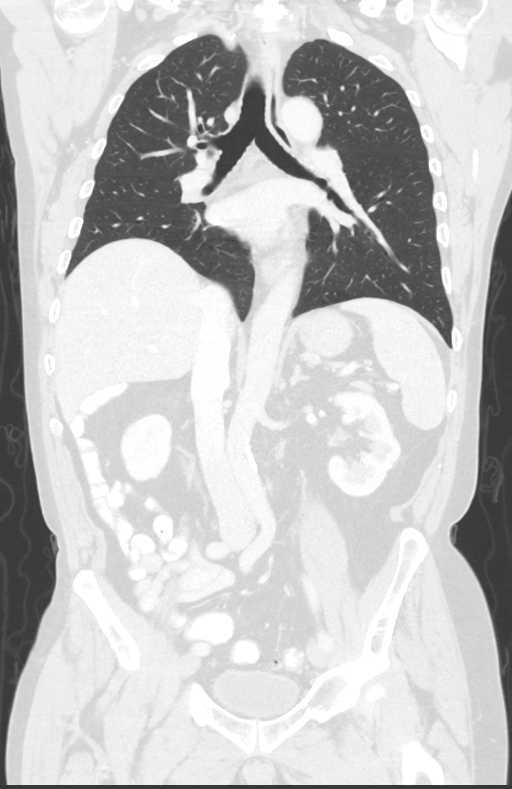

[12 of 36 positions shown; findings below may reference images not displayed]

FINDINGS: CT CHEST FINDINGS

Cardiovascular: Atherosclerotic calcification of the aorta, aortic
valve and coronary arteries. Ascending aorta measures 4.3 cm,
stable. Pulmonic trunk is enlarged. Heart size normal. No
pericardial effusion.

Mediastinum/Nodes: No pathologically enlarged mediastinal, hilar or
axillary lymph nodes. Esophagus is unremarkable.

Lungs/Pleura: Mild scattered volume loss in the right middle lobe,
lingula and right lower lobe. Vague nodule in the lingula measures 4
mm (5/79), stable and likely benign. No pleural fluid. Airway is
unremarkable.

Musculoskeletal: Degenerative changes in the spine. No worrisome
lytic or sclerotic lesions. Asymmetric degenerative change in the
left sternoclavicular joint.

CT ABDOMEN PELVIS FINDINGS

Hepatobiliary: Liver and gallbladder are unremarkable. No biliary
ductal dilatation.

Pancreas: Negative.

Spleen: Negative.

Adrenals/Urinary Tract: Adrenal glands and kidneys are unremarkable.
Ureters are decompressed. Bladder is grossly unremarkable.

Stomach/Bowel: Stomach and small bowel are unremarkable. Right
hemicolectomy. Colon is otherwise unremarkable.

Vascular/Lymphatic: Atherosclerotic calcification of the aorta
without aneurysm. No pathologically enlarged lymph nodes.

Reproductive: Prostate is visualized.

Other: Small bilateral inguinal hernias contain fat. No free fluid.
Mild fat necrosis in the lateral right abdomen ([DATE]), presumably
postoperative in etiology. Mesenteries and peritoneum are otherwise
unremarkable.

Musculoskeletal: No worrisome lytic or sclerotic lesions. Advanced
degenerative disc disease at L4-5.
IMPRESSION: 1. No definitive evidence of metastatic disease.
2. 4 mm lingular nodule is stable for 1 year and favored to be
benign. Continued attention on follow-up exams is suggested.
3. Ascending Aortic aneurysm NOS (F85CU-XLG.P), stable. Recommend
annual imaging followup by CTA or MRA. This recommendation follows
8212 ACCF/AHA/AATS/ACR/ASA/SCA/BELIC/SEIDING/OTSILE/ARIZAGA Guidelines for the
Diagnosis and Management of Patients with Thoracic Aortic Disease.
Circulation. 8212; 121: E266-e369. Aortic aneurysm NOS
(F85CU-XLG.P).
4. Aortic atherosclerosis (F85CU-170.0). Coronary artery
calcification.
5. Enlarged pulmonic trunk, indicative of pulmonary arterial
hypertension.

## 2020-03-27 ENCOUNTER — Telehealth: Payer: Self-pay

## 2020-03-27 NOTE — Telephone Encounter (Signed)
Left message for patient to remind of missed remote transmission.  

## 2020-06-25 ENCOUNTER — Ambulatory Visit (INDEPENDENT_AMBULATORY_CARE_PROVIDER_SITE_OTHER): Payer: 59 | Admitting: *Deleted

## 2020-06-25 DIAGNOSIS — R001 Bradycardia, unspecified: Secondary | ICD-10-CM | POA: Diagnosis not present

## 2020-06-26 LAB — CUP PACEART REMOTE DEVICE CHECK
Battery Impedance: 1029 Ohm
Battery Remaining Longevity: 71 mo
Battery Voltage: 2.77 V
Brady Statistic AP VP Percent: 28 %
Brady Statistic AP VS Percent: 0 %
Brady Statistic AS VP Percent: 0 %
Brady Statistic AS VS Percent: 72 %
Date Time Interrogation Session: 20210818133425
Implantable Lead Implant Date: 20040329
Implantable Lead Implant Date: 20040329
Implantable Lead Location: 753859
Implantable Lead Location: 753860
Implantable Lead Model: 4092
Implantable Lead Model: 4592
Implantable Pulse Generator Implant Date: 20120918
Lead Channel Impedance Value: 599 Ohm
Lead Channel Impedance Value: 731 Ohm
Lead Channel Pacing Threshold Amplitude: 1.375 V
Lead Channel Pacing Threshold Pulse Width: 0.4 ms
Lead Channel Setting Pacing Amplitude: 2 V
Lead Channel Setting Pacing Amplitude: 2.75 V
Lead Channel Setting Pacing Pulse Width: 0.4 ms
Lead Channel Setting Sensing Sensitivity: 2.8 mV

## 2020-06-27 NOTE — Progress Notes (Signed)
Remote pacemaker transmission.   

## 2020-08-04 ENCOUNTER — Other Ambulatory Visit: Payer: 59

## 2020-08-04 ENCOUNTER — Ambulatory Visit: Payer: 59 | Admitting: Oncology

## 2020-08-13 ENCOUNTER — Other Ambulatory Visit: Payer: Self-pay

## 2020-08-13 DIAGNOSIS — C182 Malignant neoplasm of ascending colon: Secondary | ICD-10-CM

## 2020-08-14 ENCOUNTER — Inpatient Hospital Stay: Payer: 59 | Attending: Oncology

## 2020-08-14 ENCOUNTER — Other Ambulatory Visit: Payer: Self-pay

## 2020-08-14 ENCOUNTER — Inpatient Hospital Stay (HOSPITAL_BASED_OUTPATIENT_CLINIC_OR_DEPARTMENT_OTHER): Payer: 59 | Admitting: Oncology

## 2020-08-14 ENCOUNTER — Telehealth: Payer: Self-pay | Admitting: Oncology

## 2020-08-14 VITALS — BP 125/78 | HR 58 | Temp 97.3°F | Ht 72.0 in | Wt 208.6 lb

## 2020-08-14 DIAGNOSIS — C182 Malignant neoplasm of ascending colon: Secondary | ICD-10-CM | POA: Diagnosis not present

## 2020-08-14 DIAGNOSIS — C189 Malignant neoplasm of colon, unspecified: Secondary | ICD-10-CM | POA: Diagnosis not present

## 2020-08-14 DIAGNOSIS — D509 Iron deficiency anemia, unspecified: Secondary | ICD-10-CM | POA: Insufficient documentation

## 2020-08-14 DIAGNOSIS — G4730 Sleep apnea, unspecified: Secondary | ICD-10-CM | POA: Insufficient documentation

## 2020-08-14 DIAGNOSIS — D6959 Other secondary thrombocytopenia: Secondary | ICD-10-CM | POA: Diagnosis not present

## 2020-08-14 LAB — CBC WITH DIFFERENTIAL (CANCER CENTER ONLY)
Abs Immature Granulocytes: 0.1 10*3/uL — ABNORMAL HIGH (ref 0.00–0.07)
Basophils Absolute: 0.1 10*3/uL (ref 0.0–0.1)
Basophils Relative: 1 %
Eosinophils Absolute: 0.3 10*3/uL (ref 0.0–0.5)
Eosinophils Relative: 4 %
HCT: 43.7 % (ref 39.0–52.0)
Hemoglobin: 14.5 g/dL (ref 13.0–17.0)
Immature Granulocytes: 1 %
Lymphocytes Relative: 20 %
Lymphs Abs: 1.7 10*3/uL (ref 0.7–4.0)
MCH: 31.7 pg (ref 26.0–34.0)
MCHC: 33.2 g/dL (ref 30.0–36.0)
MCV: 95.4 fL (ref 80.0–100.0)
Monocytes Absolute: 0.6 10*3/uL (ref 0.1–1.0)
Monocytes Relative: 7 %
Neutro Abs: 5.7 10*3/uL (ref 1.7–7.7)
Neutrophils Relative %: 67 %
Platelet Count: 193 10*3/uL (ref 150–400)
RBC: 4.58 MIL/uL (ref 4.22–5.81)
RDW: 12.6 % (ref 11.5–15.5)
WBC Count: 8.4 10*3/uL (ref 4.0–10.5)
nRBC: 0 % (ref 0.0–0.2)

## 2020-08-14 NOTE — Progress Notes (Signed)
  [name]   Diagnosis: Colon cancer  INTERVAL HISTORY:   [name] returns as scheduled.  He feels well.  Good appetite.  He reports having frequent bowel movements since beginning "heart medications ".  No bleeding.  Objective:  Vital signs in last 24 hours:  Blood pressure 125/78, pulse (!) 58, temperature (!) 97.3 F (36.3 C), temperature source Tympanic, height 6' (1.829 m), weight 208 lb 9.6 oz (94.6 kg), SpO2 96 %.    HEENT: Neck without mass Lymphatics: No cervical, supraclavicular, axillary, or inguinal nodes Resp: Lungs clear bilaterally Cardio: Regular rate and rhythm GI: No hepatosplenomegaly, no mass, nontender Vascular: No leg edema   Lab Results:  Lab Results  Component Value Date   WBC 8.4 08/14/2020   HGB 14.5 08/14/2020   HCT 43.7 08/14/2020   MCV 95.4 08/14/2020   PLT 193 08/14/2020   NEUTROABS 5.7 08/14/2020    CMP  Lab Results  Component Value Date   NA 141 08/22/2019   K 4.0 08/22/2019   CL 99 08/22/2019   CO2 31 08/22/2019   GLUCOSE 110 (H) 08/22/2019   BUN 14 08/22/2019   CREATININE 0.94 08/22/2019   CALCIUM 9.3 08/22/2019   PROT 7.6 08/22/2019   ALBUMIN 4.4 08/22/2019   AST 22 08/22/2019   ALT 24 08/22/2019   ALKPHOS 73 08/22/2019   BILITOT 0.9 08/22/2019   GFRNONAA >60 08/22/2019   GFRAA >60 08/22/2019    Lab Results  Component Value Date   CEA1 1.89 01/31/2020     Medications: I have reviewed the patient's current medications.   Assessment/Plan: 1. Adenocarcinoma the cecum, stage IIb (T4aN0), status post a right colectomy 09/01/2018 ? 0/14 lymph nodes positive, perineural invasion present, no macroscopic tumor perforation, no lymphovascular invasion ? MSI-stable, no loss of mismatch repair protein expression ? Colonoscopy 08/08/2018- ulcerated partially obstructing mass at the "hepatic flexure "-biopsy revealed invasive well-differentiated adenocarcinoma, no loss of mismatch  repair protein expression, 6 mm polyp in the proximal transverse colon-not removed ? CTs 08/16/2018- cecal mass, no evidence of metastatic disease ? Cycle 1 CAPOX 10/09/2018 ? Cycle 2 CAPOX 10/30/2018 ? Cycle 3 CAPOX 11/20/2018 ? Cycle 4 CAPOX 12/11/2018 ? Surveillance CT scans 08/22/2019-no definitive evidence of metastatic disease.  Stable 4 mm lingular nodule. ? Surveillance colonoscopy November 2020-report requested 01/31/2020  2. Arrhythmia-status post permanent pacemaker placement 3. Microcytic anemia secondary to #1- resolved 4. Sleep apnea 5. Mild thrombocytopenia secondary to chemotherapy-resolved    Disposition: Tracy [name] is in clinical remission from colon cancer.  He will return to the lab for a CEA today.  He is scheduled for a chest CT next month to follow-up on an aortic aneurysm.  We will arrange for a CT abdomen/pelvis the same day.  Tracy [name] will return for an office visit and CEA in 6 months.  Betsy Coder, MD  08/14/2020  11:14 AM

## 2020-08-14 NOTE — Telephone Encounter (Signed)
Nothing else to be scheduled per 10/6 los. Spoke with RN kim and no follow up appts to be scheduled.

## 2020-08-15 ENCOUNTER — Telehealth: Payer: Self-pay | Admitting: Oncology

## 2020-08-15 NOTE — Telephone Encounter (Signed)
Scheduled appointments per 10/7 los. Spoke to patient who is aware of appointments dates and times.

## 2020-08-21 ENCOUNTER — Encounter: Payer: Self-pay | Admitting: *Deleted

## 2020-08-21 NOTE — Progress Notes (Signed)
Per Dr. Benay Spice: Patient was supposed to have CEA level this month and CBC was drawn instead. Also needs to have CT abd/pelvis added to his CT chest appointment on 09/16/20 if possible. Scan being done at [facility] [phone]). Spoke w/Lisa Y at [place] and she will reach out to Dr. Rayann Heman to see if he is willing to order the scan to attach to the current radiology appointment. Lattie Haw will call back after his response. Currently has lab appointment on 09/16/20 at 10:00 prior to scan.

## 2020-08-22 ENCOUNTER — Other Ambulatory Visit: Payer: Self-pay | Admitting: *Deleted

## 2020-08-22 ENCOUNTER — Telehealth: Payer: Self-pay | Admitting: *Deleted

## 2020-08-22 DIAGNOSIS — C182 Malignant neoplasm of ascending colon: Secondary | ICD-10-CM

## 2020-08-22 NOTE — Addendum Note (Signed)
Addended by: Saddie Benders E on: 08/22/2020 02:02 PM   Modules accepted: Orders

## 2020-08-22 NOTE — Telephone Encounter (Signed)
Sent Mychart message and left VM that Dr. Rayann Heman agreed to add the CT abd/pelvis that Dr. Benay Spice needed to his scan on 09/16/20 at [facility]. He will need labs on 11/9 at 0930 and to pick up oral contrast before that day. NPO 4 hours prior except for contrast at 0830 and 0930

## 2020-09-08 ENCOUNTER — Other Ambulatory Visit: Payer: 59 | Admitting: *Deleted

## 2020-09-08 ENCOUNTER — Other Ambulatory Visit: Payer: Self-pay

## 2020-09-08 DIAGNOSIS — C182 Malignant neoplasm of ascending colon: Secondary | ICD-10-CM

## 2020-09-08 LAB — BUN: BUN: 15 mg/dL (ref 8–27)

## 2020-09-08 LAB — CREATININE, SERUM
Creatinine, Ser: 0.89 mg/dL (ref 0.76–1.27)
GFR calc Af Amer: 105 mL/min/{1.73_m2} (ref >59)
GFR calc non Af Amer: 91 mL/min/{1.73_m2} (ref >59)

## 2020-09-16 ENCOUNTER — Other Ambulatory Visit: Payer: 59

## 2020-09-16 ENCOUNTER — Ambulatory Visit (INDEPENDENT_AMBULATORY_CARE_PROVIDER_SITE_OTHER)
Admission: RE | Admit: 2020-09-16 | Discharge: 2020-09-16 | Disposition: A | Discharge status: Home / Self Care | Payer: 59 | Source: Ambulatory Visit | Attending: Internal Medicine | Admitting: Internal Medicine

## 2020-09-16 ENCOUNTER — Ambulatory Visit (HOSPITAL_COMMUNITY): Payer: 59 | Attending: Cardiology

## 2020-09-16 ENCOUNTER — Other Ambulatory Visit: Payer: Self-pay

## 2020-09-16 DIAGNOSIS — I77810 Thoracic aortic ectasia: Secondary | ICD-10-CM

## 2020-09-16 DIAGNOSIS — C182 Malignant neoplasm of ascending colon: Secondary | ICD-10-CM | POA: Diagnosis not present

## 2020-09-16 LAB — ECHOCARDIOGRAM COMPLETE
Area-P 1/2: 2.6 cm2
S' Lateral: 3.7 cm

## 2020-09-16 MED ORDER — IOHEXOL 350 MG/ML SOLN
100.0000 mL | Freq: Once | INTRAVENOUS | Status: AC | PRN
  Administered 2020-09-16: 100 mL via INTRAVENOUS

## 2020-09-17 ENCOUNTER — Inpatient Hospital Stay: Payer: 59

## 2020-09-17 ENCOUNTER — Encounter: Payer: Self-pay | Admitting: Nurse Practitioner

## 2020-09-17 ENCOUNTER — Other Ambulatory Visit: Payer: Self-pay

## 2020-09-17 ENCOUNTER — Inpatient Hospital Stay: Payer: 59 | Attending: Oncology | Admitting: Nurse Practitioner

## 2020-09-17 VITALS — BP 121/67 | HR 64 | Temp 98.1°F | Resp 17 | Ht 72.0 in | Wt 208.4 lb

## 2020-09-17 DIAGNOSIS — Z9221 Personal history of antineoplastic chemotherapy: Secondary | ICD-10-CM | POA: Diagnosis not present

## 2020-09-17 DIAGNOSIS — D509 Iron deficiency anemia, unspecified: Secondary | ICD-10-CM | POA: Diagnosis not present

## 2020-09-17 DIAGNOSIS — G4730 Sleep apnea, unspecified: Secondary | ICD-10-CM | POA: Insufficient documentation

## 2020-09-17 DIAGNOSIS — Z85038 Personal history of other malignant neoplasm of large intestine: Secondary | ICD-10-CM | POA: Insufficient documentation

## 2020-09-17 DIAGNOSIS — C182 Malignant neoplasm of ascending colon: Secondary | ICD-10-CM

## 2020-09-17 DIAGNOSIS — Z950 Presence of cardiac pacemaker: Secondary | ICD-10-CM | POA: Insufficient documentation

## 2020-09-17 LAB — CMP (CANCER CENTER ONLY)
ALT: 19 U/L (ref 0–44)
AST: 18 U/L (ref 15–41)
Albumin: 4.1 g/dL (ref 3.5–5.0)
Alkaline Phosphatase: 69 U/L (ref 38–126)
Anion gap: 6 (ref 5–15)
BUN: 16 mg/dL (ref 8–23)
CO2: 30 mmol/L (ref 22–32)
Calcium: 9.2 mg/dL (ref 8.9–10.3)
Chloride: 104 mmol/L (ref 98–111)
Creatinine: 0.98 mg/dL (ref 0.61–1.24)
GFR, Estimated: >60 mL/min (ref >60)
Glucose, Bld: 127 mg/dL — ABNORMAL HIGH (ref 70–99)
Potassium: 4.3 mmol/L (ref 3.5–5.1)
Sodium: 140 mmol/L (ref 135–145)
Total Bilirubin: 0.8 mg/dL (ref 0.3–1.2)
Total Protein: 7.3 g/dL (ref 6.5–8.1)

## 2020-09-17 LAB — CEA (IN HOUSE-CHCC): CEA (CHCC-In House): 1.88 ng/mL (ref 0.00–5.00)

## 2020-09-17 NOTE — Progress Notes (Signed)
[facility] OFFICE PROGRESS NOTE   Diagnosis: Colon cancer  INTERVAL HISTORY:   Tracy [name] returns as scheduled.  He feels well.  He has a good appetite.  No change in bowel habits.  No pain or bleeding with bowel movements.  Objective:  Vital signs in last 24 hours:  Blood pressure 121/67, pulse 64, temperature 98.1 F (36.7 C), temperature source Tympanic, resp. rate 17, height 6' (1.829 m), weight 208 lb 6.4 oz (94.5 kg), SpO2 98 %.    HEENT: Neck without mass. Lymphatics: No palpable cervical, supraclavicular or axillary lymph nodes. Resp: Lungs clear bilaterally. Cardio: Regular rate and rhythm. GI: Abdomen soft and nontender.  No hepatosplenomegaly. Vascular: No leg edema.   Lab Results:  Lab Results  Component Value Date   WBC 8.4 08/14/2020   HGB 14.5 08/14/2020   HCT 43.7 08/14/2020   MCV 95.4 08/14/2020   PLT 193 08/14/2020   NEUTROABS 5.7 08/14/2020    Imaging:  CT ABDOMEN PELVIS W CONTRAST  Result Date: 09/16/2020 CLINICAL DATA:  History of colon cancer, prior hemicolectomy in 2019. Restaging assessment. EXAM: CT ABDOMEN AND PELVIS WITH CONTRAST TECHNIQUE: Multidetector CT imaging of the abdomen and pelvis was performed using the standard protocol following bolus administration of intravenous contrast. CONTRAST:  16m OMNIPAQUE IOHEXOL 350 MG/ML SOLN COMPARISON:  08/22/2019 FINDINGS: Lower chest: Please see CT chest report from today under accession [id number]. Hepatobiliary: Unremarkable. No findings of metastatic lesion to the liver. Pancreas: Unremarkable Spleen: Unremarkable Adrenals/Urinary Tract: Unremarkable Stomach/Bowel: Right hemicolectomy. Minimal bandlike scarring in the right upper quadrant adjacent to the gallbladder and proximal margin of colon, slightly more confluent but smaller than on the 08/22/2019 exam, and felt to be postinflammatory. Sigmoid colon diverticulosis. Vascular/Lymphatic: Aortoiliac atherosclerotic vascular  disease. Reproductive: Unremarkable Other: No supplemental non-categorized findings. Musculoskeletal: Lumbar spondylosis and degenerative disc disease most notably at L4-5. IMPRESSION: 1. No findings of recurrent malignancy. 2. Right hemicolectomy. Minimal bandlike scarring in the right upper quadrant adjacent to the gallbladder and proximal margin of colon, slightly more confluent but smaller than on the 08/22/2019 exam, and felt to be postinflammatory. 3. Sigmoid colon diverticulosis. 4. Lumbar spondylosis and degenerative disc disease most notably at L4-5. 5. Aortic atherosclerosis. Aortic Atherosclerosis (ICD10-I70.0). Electronically Signed   By: WVan ClinesM.D.   On: 09/16/2020 16:36   CT ANGIO CHEST AORTA W/CM & OR WO/CM  Result Date: 09/16/2020 CLINICAL DATA:  Evaluate thoracic aortic aneurysm. History of colon cancer post hemicolectomy 09/2018. EXAM: CT ANGIOGRAPHY CHEST WITH CONTRAST TECHNIQUE: Multidetector CT imaging of the chest was performed using the standard protocol during bolus administration of intravenous contrast. Multiplanar CT image reconstructions and MIPs were obtained to evaluate the vascular anatomy. CONTRAST:  1034mOMNIPAQUE IOHEXOL 350 MG/ML SOLN COMPARISON:  CT abdomen pelvis-earlier same day; CT the chest, abdomen and pelvis-08/22/2019; 08/16/2018 FINDINGS: Vascular Findings: Stable fusiform aneurysmal dilatation of the ascending thoracic aorta was measurements as follows. The thoracic aorta tapers to a normal caliber at the level of the aortic arch. Bovine configuration of the aortic arch. The descending thoracic aorta is of normal caliber. Borderline cardiomegaly. No pericardial effusion. Coronary artery calcifications. Pacer leads terminate within the right atrium and ventricle. Although this examination was not tailored for the evaluation the pulmonary arteries, there are no discrete filling defects within the central pulmonary arterial tree to suggest central pulmonary  embolism. Enlarged caliber of the main pulmonary artery measuring 36 mm in diameter, unchanged. ------------------------------------------------------------- Thoracic aortic measurements: Sinotubular junction 36 mm  as measured in greatest oblique short axis coronal dimension. Proximal ascending aorta 45 mm as measured in greatest oblique short axis axial dimension at the level of the main pulmonary artery (image 46, series 4) and approximately 45 mm in greatest oblique short axis coronal diameter (coronal image 58, series 7), unchanged compared to the 08/2018 examination. Aortic arch aorta 30 mm as measured in greatest oblique short axis sagittal dimension. Proximal descending thoracic aorta 27 mm as measured in greatest oblique short axis axial dimension at the level of the main pulmonary artery. Distal descending thoracic aorta 25 mm as measured in greatest oblique short axis axial dimension at the level of the diaphragmatic hiatus. Review of the MIP images confirms the above findings. ------------------------------------------------------------- Non-Vascular Findings: Mediastinum/Lymph Nodes: No bulky mediastinal, hilar or axillary lymphadenopathy Lungs/Pleura: Minimal subsegmental atelectasis angle involving. No discrete the right costophrenic focal airspace opacities. No pleural effusion or pneumothorax. The central pulmonary airways appear widely patent. No discrete pulmonary nodules. Upper abdomen: Please refer to dedicated abdominal CT performed same day and with separate dictation. Musculoskeletal: No acute or aggressive osseous abnormalities. Moderate DDD of C6 C7-T1 with disc space height loss, endplate irregularity and sclerosis. The thyroid gland appears slightly atrophic but without discrete nodule or mass. IMPRESSION: 1. Stable uncomplicated fusiform aneurysmal dilatation of the ascending thoracic aorta measuring 45 mm in diameter, unchanged compared to the 08/2018 examination. Recommend semi-annual  imaging followup by CTA or MRA and referral to cardiothoracic surgery if not already obtained. This recommendation follows 2010 ACCF/AHA/AATS/ACR/ASA/SCA/SCAI/SIR/STS/SVM Guidelines for the Diagnosis and Management of Patients With Thoracic Aortic Disease. Circulation. 2010; 121: B867-J449. Aortic aneurysm NOS (ICD10-I71.9) 2. Coronary artery calcifications. Aortic Atherosclerosis (ICD10-I70.0). 3. Please refer to separate dictation regarding dedicated CT of the abdomen pelvis performed earlier same day. Electronically Signed   By: Sandi Mariscal M.D.   On: 09/16/2020 12:34   ECHOCARDIOGRAM COMPLETE  Result Date: 09/16/2020    ECHOCARDIOGRAM REPORT   Patient Name:   Tracy [name]  Date of Exam: 09/16/2020 Medical Rec #:  [id number]     Height:       72.0 in Accession #:    [id number]    Weight:       208.6 lb Date of Birth:  May 25, 1956     BSA:          2.169 m Patient Age:    64 years      BP:           125/78 mmHg Patient Gender: M             HR:           57 bpm. Exam Location:  [place] [facility] Procedure: 2D Echo, Cardiac Doppler, Color Doppler, 3D Echo and Strain Analysis Indications:    I77.810 Dilated Aortic Root  History:        Patient has prior history of Echocardiogram examinations, most                 recent 10/04/2018. Pacemaker; Risk Factors:Dyslipidemia.                 Obstructive sleep apnea. Syncope.  Sonographer:    Wilford Sports Rodgers-Jones RDCS Referring Phys: [place]  1. Moderately dilated ascending aorta with maximum diameter 45 mm, a chest CTA is recommended for further evaluation.  2. Left ventricular ejection fraction, by estimation, is 60 to 65%. The left ventricle has normal function. The left ventricle has no regional wall motion abnormalities. The left  ventricular internal cavity size was mildly dilated. Left ventricular diastolic parameters are consistent with Grade I diastolic dysfunction (impaired relaxation). The average left ventricular global longitudinal strain is  -22.7 %. The global longitudinal strain is normal.  3. Right ventricular systolic function is normal. The right ventricular size is normal. There is normal pulmonary artery systolic pressure.  4. Left atrial size was moderately dilated.  5. The mitral valve is normal in structure. No evidence of mitral valve regurgitation. No evidence of mitral stenosis.  6. The aortic valve is normal in structure. Aortic valve regurgitation is not visualized. No aortic stenosis is present.  7. Aortic dilatation noted. There is moderate dilatation of the ascending aorta, measuring 45 mm.  8. The inferior vena cava is normal in size with greater than 50% respiratory variability, suggesting right atrial pressure of 3 mmHg. FINDINGS  Left Ventricle: Left ventricular ejection fraction, by estimation, is 60 to 65%. The left ventricle has normal function. The left ventricle has no regional wall motion abnormalities. The average left ventricular global longitudinal strain is -22.7 %. The global longitudinal strain is normal. The left ventricular internal cavity size was mildly dilated. There is no left ventricular hypertrophy. Abnormal (paradoxical) septal motion, consistent with RV pacemaker. Left ventricular diastolic parameters are consistent with Grade I diastolic dysfunction (impaired relaxation). Normal left ventricular filling pressure. Right Ventricle: The right ventricular size is normal. No increase in right ventricular wall thickness. Right ventricular systolic function is normal. There is normal pulmonary artery systolic pressure. Left Atrium: Left atrial size was moderately dilated. Right Atrium: Right atrial size was normal in size. Pericardium: There is no evidence of pericardial effusion. Mitral Valve: The mitral valve is normal in structure. No evidence of mitral valve regurgitation. No evidence of mitral valve stenosis. Tricuspid Valve: The tricuspid valve is normal in structure. Tricuspid valve regurgitation is mild . No  evidence of tricuspid stenosis. Aortic Valve: The aortic valve is normal in structure. Aortic valve regurgitation is not visualized. No aortic stenosis is present. Pulmonic Valve: The pulmonic valve was normal in structure. Pulmonic valve regurgitation is not visualized. No evidence of pulmonic stenosis. Aorta: Aortic dilatation noted. There is moderate dilatation of the ascending aorta, measuring 45 mm. Venous: The inferior vena cava is normal in size with greater than 50% respiratory variability, suggesting right atrial pressure of 3 mmHg. IAS/Shunts: No atrial level shunt detected by color flow Doppler. Additional Comments: A pacer wire is visualized in the right atrium and right ventricle.  LEFT VENTRICLE PLAX 2D LVIDd:         5.50 cm  Diastology LVIDs:         3.70 cm  LV e' medial:    11.10 cm/s LV PW:         0.90 cm  LV E/e' medial:  4.3 LV IVS:        0.60 cm  LV e' lateral:   8.92 cm/s LVOT diam:     2.30 cm  LV E/e' lateral: 5.3 LV SV:         67 LV SV Index:   31       2D Longitudinal Strain LVOT Area:     4.15 cm 2D Strain GLS (A2C):   -24.2 %                         2D Strain GLS (A3C):   -23.0 %  2D Strain GLS (A4C):   -21.1 %                         2D Strain GLS Avg:     -22.7 %                          3D Volume EF:                         3D EF:        55 %                         LV EDV:       144 ml                         LV ESV:       65 ml                         LV SV:        79 ml RIGHT VENTRICLE RV Basal diam:  4.30 cm RV S prime:     13.60 cm/s TAPSE (M-mode): 2.0 cm LEFT ATRIUM             Index       RIGHT ATRIUM           Index LA diam:        4.70 cm 2.17 cm/m  RA Area:     13.30 cm LA Vol (A2C):   48.9 ml 22.54 ml/m RA Volume:   36.40 ml  16.78 ml/m LA Vol (A4C):   43.6 ml 20.10 ml/m LA Biplane Vol: 46.2 ml 21.30 ml/m  AORTIC VALVE LVOT Vmax:   82.55 cm/s LVOT Vmean:  61.950 cm/s LVOT VTI:    0.162 m  AORTA Ao Root diam: 4.00 cm Ao Asc diam:  4.50 cm  MITRAL VALVE MV Area (PHT): 2.60 cm    SHUNTS MV Decel Time: 292 msec    Systemic VTI:  0.16 m MV E velocity: 47.60 cm/s  Systemic Diam: 2.30 cm MV A velocity: 70.70 cm/s MV E/A ratio:  0.67 Ena Dawley MD Electronically signed by Ena Dawley MD Signature Date/Time: 09/16/2020/12:57:41 PM    Final     Medications: I have reviewed the patient's current medications.  Assessment/Plan: 1. Adenocarcinoma the cecum, stage IIb (T4aN0), status post a right colectomy 09/01/2018 ? 0/14 lymph nodes positive, perineural invasion present, no macroscopic tumor perforation, no lymphovascular invasion ? MSI-stable, no loss of mismatch repair protein expression ? Colonoscopy 08/08/2018-ulcerated partially obstructing mass at the "hepatic flexure "-biopsy revealed invasive well-differentiated adenocarcinoma, no loss of mismatch repair protein expression, 6 mm polyp in the proximal transverse colon-not removed ? CTs 08/16/2018-cecal mass, no evidence of metastatic disease ? Cycle 1 CAPOX 10/09/2018 ? Cycle 2 CAPOX 10/30/2018 ? Cycle 3 CAPOX 11/20/2018 ? Cycle 4 CAPOX 12/11/2018 ? Surveillance CT scans 08/22/2019-no definitive evidence of metastatic disease.  Stable 4 mm lingular nodule. ? Surveillance colonoscopy November 2020-report requested 01/31/2020 ? CTs 09/16/2020-no metastatic disease  2. Arrhythmia-status post permanent pacemaker placement 3. Microcytic anemia secondary to #1-resolved 4. Sleep apnea 5. Mild thrombocytopenia secondary to chemotherapy-resolved  Disposition: Mr. Barna remains in remission from colon cancer.  We will follow-up on the CEA from today.  The recent surveillance CT  scans show no evidence of metastatic disease.  He will return for an office visit and CEA in 5 to 6 months.    Ned Card ANP/GNP-BC   09/17/2020  12:19 PM

## 2020-09-17 NOTE — Progress Notes (Deleted)
  [facility] OFFICE PROGRESS NOTE

## 2020-09-18 ENCOUNTER — Telehealth: Payer: Self-pay

## 2020-09-18 ENCOUNTER — Telehealth: Payer: Self-pay | Admitting: Nurse Practitioner

## 2020-09-18 NOTE — Telephone Encounter (Signed)
Scheduled appointments per 11/10 los. Called patient, no answer. Left message for patient with appointment date and time.

## 2020-09-18 NOTE — Telephone Encounter (Signed)
-----   Message from Owens Shark, NP sent at 09/17/2020  1:28 PM EST ----- Please let him know the CEA is stable in normal range.  Follow-up as scheduled.

## 2020-09-18 NOTE — Telephone Encounter (Signed)
called left message on both home and cell phone for CEA results awaiting return call

## 2021-01-07 ENCOUNTER — Other Ambulatory Visit: Payer: Self-pay | Admitting: Internal Medicine

## 2021-01-08 NOTE — Telephone Encounter (Signed)
Pt's pharmacy is requesting a refill on paroxetine. Would Dr. Rayann Heman like to refill this medication? Please address

## 2021-02-05 ENCOUNTER — Encounter: Payer: Self-pay | Admitting: Internal Medicine

## 2021-02-05 NOTE — Telephone Encounter (Signed)
error 

## 2021-02-06 ENCOUNTER — Other Ambulatory Visit: Payer: Self-pay | Admitting: Internal Medicine

## 2021-02-06 ENCOUNTER — Ambulatory Visit (INDEPENDENT_AMBULATORY_CARE_PROVIDER_SITE_OTHER): Payer: 59

## 2021-02-06 DIAGNOSIS — R001 Bradycardia, unspecified: Secondary | ICD-10-CM

## 2021-02-06 LAB — CUP PACEART REMOTE DEVICE CHECK
Battery Impedance: 1269 Ohm
Battery Remaining Longevity: 60 mo
Battery Voltage: 2.77 V
Brady Statistic AP VP Percent: 26 %
Brady Statistic AP VS Percent: 0 %
Brady Statistic AS VP Percent: 0 %
Brady Statistic AS VS Percent: 73 %
Date Time Interrogation Session: 20220401075639
Implantable Lead Implant Date: 20040329
Implantable Lead Implant Date: 20040329
Implantable Lead Location: 753859
Implantable Lead Location: 753860
Implantable Lead Model: 4092
Implantable Lead Model: 4592
Implantable Pulse Generator Implant Date: 20120918
Lead Channel Impedance Value: 599 Ohm
Lead Channel Impedance Value: 753 Ohm
Lead Channel Pacing Threshold Amplitude: 1.25 V
Lead Channel Pacing Threshold Pulse Width: 0.4 ms
Lead Channel Setting Pacing Amplitude: 2 V
Lead Channel Setting Pacing Amplitude: 2.5 V
Lead Channel Setting Pacing Pulse Width: 0.4 ms
Lead Channel Setting Sensing Sensitivity: 2.8 mV

## 2021-02-08 NOTE — Progress Notes (Signed)
Cardiology Office Note Date:  02/08/2021  Patient ID:  Tracy [name], Tracy [name] Jul 16, 1956, MRN [id number] PCP:  Orpah Melter, MD  Electrophysiologist: Dr. Rayann Heman     Chief Complaint:  annual visit  History of Present Illness: Tracy [name] is a 65 y.o. male with history of symptomatic bradycardia w/PPM, PVCs, OSA (severe) w/CPAP, dysautonomia, dilated ascending Ao, colon cancer (s/p hemicolectomy), HLD  He comes in today to be seen for Dr. Rayann Heman, last seen by him via tele health visit feb 2021.  He was doing well, compliant with CPAP, planned for CT/echo to follow up on his Ao Oct 2021.  Resumed on remote schedule.  CT noted stable aorta and recommended semi-annual imaging  TODAY He is doing well L knee is quite painful and his R knee starting to give him trouble, this limits his ability to exercise. He is active though and denies any difficulties or symptoms with his ADLs No CP, palpitations or cardiac awareness No dizzy spells, near syncope or syncope. No SOB, DOE  He does not follow with his PMD, pt and wife report that Dr. Rayann Heman has taken care of his medicines and labs  Sees his oncologist   Device information MDT dual chamber PPM implanted 2004, gen change 07/27/2011   Past Medical History:  Diagnosis Date  . Bradycardia 01/01/03   s/p PPM (MDT)  . Cancer [facility])    colon cancer  . Dyslipidemia   . Myopia   . OSA (obstructive sleep apnea) 07/21/2011   NPSG 2012:  AHI 88/hr with both obstructive and central events.  Auto 10/2013:  Optimal pressure 16cm.    . Presence of permanent cardiac pacemaker    due to node dysfunction  . Sleep apnea 9/12   severe  . Syncope 2008   dysautonomia    Past Surgical History:  Procedure Laterality Date  . CARDIAC CATHETERIZATION  2008   no CAD  . HERNIA REPAIR Left    inguinal hernia  . LAPAROSCOPIC PARTIAL COLECTOMY N/A 09/01/2018   Procedure: LAPAROSCOPIC ASSISTED PARTIAL COLECTOMY;  Surgeon: Coralie Keens, MD;  Location:  [place];  Service: General;  Laterality: N/A;  . MENISCUS REPAIR Left   . PACEMAKER PLACEMENT  01/01/03   generator change (MDT) by Dr Rayann Heman 2012  . PARTIAL COLECTOMY  09/01/2018    Current Outpatient Medications  Medication Sig Dispense Refill  . atorvastatin (LIPITOR) 10 MG tablet TAKE 1 TABLET(10 MG) BY MOUTH DAILY. PLEASE MAKE OVERDUE APPOINTMENT WITH DOCTOR ALLRED BEFORE ANYMORE REFILLS. THANK YOU FIRST ATTEMPT 30 tablet 11  . ibuprofen (ADVIL,MOTRIN) 200 MG tablet Take 400 mg by mouth every 8 (eight) hours as needed (for pain.).    Marland Kitchen loperamide (IMODIUM) 2 MG capsule Take 2 mg by mouth daily.     . metoprolol succinate (TOPROL-XL) 50 MG 24 hr tablet TAKE 1 TABLET(50 MG) BY MOUTH DAILY. PLEASE MAKE OVERDUE APPOINTMENT WITH DOCTOR ALLRED BEFORE ANYMORE REFILLS. THANK YOU FIRST ATTEMPT 30 tablet 11  . PARoxetine (PAXIL) 20 MG tablet TAKE 1 TABLET(20 MG) BY MOUTH DAILY 90 tablet 3   No current facility-administered medications for this visit.    Allergies:   Patient has no known allergies.   Social History:  The patient  reports that he quit smoking about 2 years ago. His smoking use included cigars. He quit after 1.00 year of use. He has never used smokeless tobacco. He reports previous alcohol use. He reports that he does not use drugs.   Family History:  The patient's  family history includes Heart disease in an other family member.  ROS:  Please see the history of present illness.    All other systems are reviewed and otherwise negative.   PHYSICAL EXAM:  VS:  There were no vitals taken for this visit. BMI: There is no height or weight on file to calculate BMI. Well nourished, well developed, in no acute distress HEENT: normocephalic, atraumatic Neck: no JVD, carotid bruits or masses Cardiac:  RRR; no significant murmurs, no rubs, or gallops Lungs:  CTA b/l, no wheezing, rhonchi or rales Abd: soft, nontender MS: no deformity or atrophy Ext: no edema Skin: warm and dry, no  rash Neuro:  No gross deficits appreciated Psych: euthymic mood, full affect  PPM site (R side) is stable, no tethering or discomfort   EKG:  Done today and reviewed by myself shows  SR 63bpm, PVCs (known for him), no changes from prior  Device interrogation done today and reviewed by myself:  Battery and lead measurements are good One NSVT June last year and previously seen No AMS AS/VS 73,3%  09/16/2020: TTE IMPRESSIONS  1. Moderately dilated ascending aorta with maximum diameter 45 mm, a  chest CTA is recommended for further evaluation.  2. Left ventricular ejection fraction, by estimation, is 60 to 65%. The  left ventricle has normal function. The left ventricle has no regional  wall motion abnormalities. The left ventricular internal cavity size was  mildly dilated. Left ventricular  diastolic parameters are consistent with Grade I diastolic dysfunction  (impaired relaxation). The average left ventricular global longitudinal  strain is -22.7 %. The global longitudinal strain is normal.  3. Right ventricular systolic function is normal. The right ventricular  size is normal. There is normal pulmonary artery systolic pressure.  4. Left atrial size was moderately dilated.  5. The mitral valve is normal in structure. No evidence of mitral valve  regurgitation. No evidence of mitral stenosis.  6. The aortic valve is normal in structure. Aortic valve regurgitation is  not visualized. No aortic stenosis is present.  7. Aortic dilatation noted. There is moderate dilatation of the ascending  aorta, measuring 45 mm.  8. The inferior vena cava is normal in size with greater than 50%  respiratory variability, suggesting right atrial pressure of 3 mmHg.    09/16/20: CT angio chest IMPRESSION: 1. Stable uncomplicated fusiform aneurysmal dilatation of the ascending thoracic aorta measuring 45 mm in diameter, unchanged compared to the 08/2018 examination. Recommend semi-annual  imaging followup by CTA or MRA and referral to cardiothoracic surgery if not already obtained. This recommendation follows 2010 ACCF/AHA/AATS/ACR/ASA/SCA/SCAI/SIR/STS/SVM Guidelines for the Diagnosis and Management of Patients With Thoracic Aortic Disease. Circulation. 2010; 121: Q683-M196. Aortic aneurysm NOS (ICD10-I71.9) 2. Coronary artery calcifications. Aortic Atherosclerosis (ICD10-I70.0). 3. Please refer to separate dictation regarding dedicated CT of the abdomen pelvis performed earlier same day.    Recent Labs: 08/14/2020: Hemoglobin 14.5; Platelet Count 193 09/17/2020: ALT 19; BUN 16; Creatinine 0.98; Potassium 4.3; Sodium 140  No results found for requested labs within last 8760 hours.   CrCl cannot be calculated (Patient's most recent lab result is older than the maximum 21 days allowed.).   Wt Readings from Last 3 Encounters:  09/17/20 208 lb 6.4 oz (94.5 kg)  08/14/20 208 lb 9.6 oz (94.6 kg)  01/31/20 207 lb 11.2 oz (94.2 kg)     Other studies reviewed: Additional studies/records reviewed today include: summarized above  ASSESSMENT AND PLAN:  1. PPM     Intact  function, no programming changes made  2. dilated ascending Ao     Stable by last imaging     Semi-annual CTs recommended     Plan for CT  3. PVCs     Preserved LVEF by echo last fall     No symptoms to suggest clinical change  4. HLD     Labs today     He is fasting  5. Chronically on Paxil     Dr. Rayann Heman has been filling     Will provide refills      Discussed importance of PMD for management of non-cardiac screening needs, or medial issues that come up.  The patient and wife report his prior EP functioned as his PMD and so far Dr. Rayann Heman has filled his medicines.    Disposition: as above F/u with remotes as usual, in clinic in 100mo, sooner if needed  Current medicines are reviewed at length with the patient today.  The patient did not have any concerns regarding  medicines.  Venetia Night, PA-C 02/08/2021 1:32 PM     [address] [place] [address] [phone] (office)  [phone] (fax)

## 2021-02-10 ENCOUNTER — Ambulatory Visit (INDEPENDENT_AMBULATORY_CARE_PROVIDER_SITE_OTHER): Payer: 59 | Admitting: Physician Assistant

## 2021-02-10 ENCOUNTER — Encounter: Payer: Self-pay | Admitting: Physician Assistant

## 2021-02-10 ENCOUNTER — Other Ambulatory Visit: Payer: Self-pay

## 2021-02-10 VITALS — BP 120/72 | HR 63 | Ht 72.0 in | Wt 210.0 lb

## 2021-02-10 DIAGNOSIS — I77810 Thoracic aortic ectasia: Secondary | ICD-10-CM | POA: Diagnosis not present

## 2021-02-10 DIAGNOSIS — E785 Hyperlipidemia, unspecified: Secondary | ICD-10-CM | POA: Diagnosis not present

## 2021-02-10 DIAGNOSIS — I712 Thoracic aortic aneurysm, without rupture, unspecified: Secondary | ICD-10-CM

## 2021-02-10 DIAGNOSIS — Z950 Presence of cardiac pacemaker: Secondary | ICD-10-CM | POA: Diagnosis not present

## 2021-02-10 LAB — BASIC METABOLIC PANEL
BUN/Creatinine Ratio: 15 (ref 10–24)
BUN: 13 mg/dL (ref 8–27)
CO2: 25 mmol/L (ref 20–29)
Calcium: 9.4 mg/dL (ref 8.6–10.2)
Chloride: 100 mmol/L (ref 96–106)
Creatinine, Ser: 0.89 mg/dL (ref 0.76–1.27)
Glucose: 108 mg/dL — ABNORMAL HIGH (ref 65–99)
Potassium: 4.9 mmol/L (ref 3.5–5.2)
Sodium: 140 mmol/L (ref 134–144)
eGFR: 96 mL/min/{1.73_m2} (ref >59)

## 2021-02-10 LAB — HEPATIC FUNCTION PANEL
ALT: 23 IU/L (ref 0–44)
AST: 21 IU/L (ref 0–40)
Albumin: 4.7 g/dL (ref 3.8–4.8)
Alkaline Phosphatase: 74 IU/L (ref 44–121)
Bilirubin Total: 0.5 mg/dL (ref 0.0–1.2)
Bilirubin, Direct: 0.15 mg/dL (ref 0.00–0.40)
Total Protein: 7.3 g/dL (ref 6.0–8.5)

## 2021-02-10 LAB — LIPID PANEL
Chol/HDL Ratio: 3.4 ratio (ref 0.0–5.0)
Cholesterol, Total: 172 mg/dL (ref 100–199)
HDL: 51 mg/dL (ref >39)
LDL Chol Calc (NIH): 102 mg/dL — ABNORMAL HIGH (ref 0–99)
Triglycerides: 103 mg/dL (ref 0–149)
VLDL Cholesterol Cal: 19 mg/dL (ref 5–40)

## 2021-02-10 MED ORDER — PAROXETINE HCL 20 MG PO TABS: ORAL_TABLET | ORAL | 3 refills | Status: DC

## 2021-02-10 MED ORDER — ATORVASTATIN CALCIUM 10 MG PO TABS: ORAL_TABLET | ORAL | 11 refills | Status: DC

## 2021-02-10 MED ORDER — METOPROLOL SUCCINATE ER 50 MG PO TB24: ORAL_TABLET | ORAL | 11 refills | Status: DC

## 2021-02-10 NOTE — Patient Instructions (Signed)
Medication Instructions:   Your physician recommends that you continue on your current medications as directed. Please refer to the Current Medication list given to you today.   *If you need a refill on your cardiac medications before your next appointment, please call your pharmacy*   Lab Work:  BMET  AND LFT  AND LIPIDS TODAY     If you have labs (blood work) drawn today and your tests are completely normal, you will receive your results only by: Marland Kitchen MyChart Message (if you have MyChart) OR . A paper copy in the mail If you have any lab test that is abnormal or we need to change your treatment, we will call you to review the results.   Testing/Procedures: Non-Cardiac CT scanning, (CAT scanning), is a noninvasive, special x-ray that produces cross-sectional images of the body using x-rays and a computer. CT scans help physicians diagnose and treat medical conditions. For some CT exams, a contrast material is used to enhance visibility in the area of the body being studied. CT scans provide greater clarity and reveal more details than regular x-ray exams.    Follow-Up: At [facility], you and your health needs are our priority.  As part of our continuing mission to provide you with exceptional heart care, we have created designated Provider Care Teams.  These Care Teams include your primary Cardiologist (physician) and Advanced Practice Providers (APPs -  Physician Assistants and Nurse Practitioners) who all work together to provide you with the care you need, when you need it.  We recommend signing up for the patient portal called "MyChart".  Sign up information is provided on this After Visit Summary.  MyChart is used to connect with patients for Virtual Visits (Telemedicine).  Patients are able to view lab/test results, encounter notes, upcoming appointments, etc.  Non-urgent messages can be sent to your provider as well.   To learn more about what you can do with MyChart, go to  [email or web address].    Your next appointment:   6 month(s)  The format for your next appointment:   In Person  Provider:   You may see Dr. Rayann Heman  or one of the following Advanced Practice Providers on your designated Care Team:    Chanetta Marshall, NP  Tommye Standard, PA-C  Legrand Como "[place], [place]    Other Instructions

## 2021-02-11 ENCOUNTER — Other Ambulatory Visit: Payer: Self-pay | Admitting: *Deleted

## 2021-02-11 LAB — CUP PACEART INCLINIC DEVICE CHECK
Battery Impedance: 1266 Ohm
Battery Remaining Longevity: 60 mo
Battery Voltage: 2.77 V
Brady Statistic AP VP Percent: 26 %
Brady Statistic AP VS Percent: 0 %
Brady Statistic AS VP Percent: 0 %
Brady Statistic AS VS Percent: 73 %
Date Time Interrogation Session: 20220405074100
Implantable Lead Implant Date: 20040329
Implantable Lead Implant Date: 20040329
Implantable Lead Location: 753859
Implantable Lead Location: 753860
Implantable Lead Model: 4092
Implantable Lead Model: 4592
Implantable Pulse Generator Implant Date: 20120918
Lead Channel Impedance Value: 610 Ohm
Lead Channel Impedance Value: 751 Ohm
Lead Channel Pacing Threshold Amplitude: 0.5 V
Lead Channel Pacing Threshold Amplitude: 1 V
Lead Channel Pacing Threshold Amplitude: 1.125 V
Lead Channel Pacing Threshold Pulse Width: 0.4 ms
Lead Channel Pacing Threshold Pulse Width: 0.4 ms
Lead Channel Pacing Threshold Pulse Width: 0.46 ms
Lead Channel Sensing Intrinsic Amplitude: 4 mV
Lead Channel Sensing Intrinsic Amplitude: 5.6 mV
Lead Channel Setting Pacing Amplitude: 2 V
Lead Channel Setting Pacing Amplitude: 2.5 V
Lead Channel Setting Pacing Pulse Width: 0.46 ms
Lead Channel Setting Sensing Sensitivity: 2.8 mV

## 2021-02-11 MED ORDER — ATORVASTATIN CALCIUM 20 MG PO TABS: 20.0000 mg | ORAL_TABLET | Freq: Every day | ORAL | 3 refills | Status: DC

## 2021-02-11 MED ORDER — PAROXETINE HCL 20 MG PO TABS: ORAL_TABLET | ORAL | 3 refills | Status: DC

## 2021-02-16 ENCOUNTER — Inpatient Hospital Stay: Payer: 59

## 2021-02-16 ENCOUNTER — Other Ambulatory Visit: Payer: Self-pay

## 2021-02-16 ENCOUNTER — Inpatient Hospital Stay: Payer: 59 | Attending: Oncology | Admitting: Oncology

## 2021-02-16 ENCOUNTER — Other Ambulatory Visit: Payer: 59

## 2021-02-16 VITALS — BP 133/86 | HR 64 | Temp 98.3°F | Resp 17 | Wt 209.8 lb

## 2021-02-16 DIAGNOSIS — C182 Malignant neoplasm of ascending colon: Secondary | ICD-10-CM

## 2021-02-16 DIAGNOSIS — Z85048 Personal history of other malignant neoplasm of rectum, rectosigmoid junction, and anus: Secondary | ICD-10-CM | POA: Diagnosis not present

## 2021-02-16 DIAGNOSIS — G4730 Sleep apnea, unspecified: Secondary | ICD-10-CM | POA: Insufficient documentation

## 2021-02-16 DIAGNOSIS — I499 Cardiac arrhythmia, unspecified: Secondary | ICD-10-CM | POA: Insufficient documentation

## 2021-02-16 DIAGNOSIS — Z9221 Personal history of antineoplastic chemotherapy: Secondary | ICD-10-CM | POA: Diagnosis not present

## 2021-02-16 NOTE — Progress Notes (Signed)
  Tracy [name] OFFICE PROGRESS NOTE   Diagnosis: Colon cancer  INTERVAL HISTORY:   Tracy [name] returns as scheduled.  He feels well.  He continues to have loose stool.  He relates this to taking cardiac medications as this predates the diagnosis of colon cancer.  No other complaint except for a clicking sound in his knees.  He is followed by orthopedics.  Objective:  Vital signs in last 24 hours:  Blood pressure 133/86, pulse 64, temperature 98.3 F (36.8 C), temperature source Oral, resp. rate 17, weight 209 lb 12.8 oz (95.2 kg), SpO2 97 %.    Lymphatics: No cervical, supraclavicular, axillary, or inguinal nodes Resp: Lungs clear bilaterally Cardio: Irregular GI: No hepatosplenomegaly, nontender, no mass Vascular: No leg edema   Lab Results:  Lab Results  Component Value Date   WBC 8.4 08/14/2020   HGB 14.5 08/14/2020   HCT 43.7 08/14/2020   MCV 95.4 08/14/2020   PLT 193 08/14/2020   NEUTROABS 5.7 08/14/2020    CMP  Lab Results  Component Value Date   NA 140 02/10/2021   K 4.9 02/10/2021   CL 100 02/10/2021   CO2 25 02/10/2021   GLUCOSE 108 (H) 02/10/2021   BUN 13 02/10/2021   CREATININE 0.89 02/10/2021   CALCIUM 9.4 02/10/2021   PROT 7.3 02/10/2021   ALBUMIN 4.7 02/10/2021   AST 21 02/10/2021   ALT 23 02/10/2021   ALKPHOS 74 02/10/2021   BILITOT 0.5 02/10/2021   GFRNONAA >60 09/17/2020   GFRAA 105 09/08/2020    Lab Results  Component Value Date   CEA1 1.88 09/17/2020     Medications: I have reviewed the patient's current medications.   Assessment/Plan: 1. Adenocarcinoma the cecum, stage IIb (T4aN0), status post a right colectomy 09/01/2018 ? 0/14 lymph nodes positive, perineural invasion present, no macroscopic tumor perforation, no lymphovascular invasion ? MSI-stable, no loss of mismatch repair protein expression ? Colonoscopy 08/08/2018-ulcerated partially obstructing mass at the "hepatic flexure "-biopsy revealed invasive  well-differentiated adenocarcinoma, no loss of mismatch repair protein expression, 6 mm polyp in the proximal transverse colon-not removed ? CTs 08/16/2018-cecal mass, no evidence of metastatic disease ? Cycle 1 CAPOX 10/09/2018 ? Cycle 2 CAPOX 10/30/2018 ? Cycle 3 CAPOX 11/20/2018 ? Cycle 4 CAPOX 12/11/2018 ? Surveillance CT scans 08/22/2019-no definitive evidence of metastatic disease.  Stable 4 mm lingular nodule. ? Surveillance colonoscopy November 2020 ? CTs 09/16/2020-no metastatic disease  2. Arrhythmia-status post permanent pacemaker placement 3. Microcytic anemia secondary to #1-resolved 4. Sleep apnea 5. Mild thrombocytopenia secondary to chemotherapy-resolved    Disposition: Tracy [name] is in clinical remission from colon cancer.  We will follow up on the CEA from today.  He will return for an office visit, CEA, and restaging CTs in 6 months.  He will continue colonoscopy surveillance with Tracy [name].  Betsy Coder, MD  02/16/2021  3:09 PM

## 2021-02-17 ENCOUNTER — Telehealth: Payer: Self-pay

## 2021-02-17 LAB — CEA (IN HOUSE-CHCC): CEA (CHCC-In House): 2.08 ng/mL (ref 0.00–5.00)

## 2021-02-17 NOTE — Telephone Encounter (Signed)
-----   Message from Ladell Pier, MD sent at 02/17/2021 12:11 PM EDT ----- Please call patient, the CEA is normal, follow-up as scheduled

## 2021-02-17 NOTE — Telephone Encounter (Signed)
Spoke with pt made him aware of most recent lab result CEA is normal follow up as scheduled

## 2021-02-18 NOTE — Progress Notes (Signed)
Remote pacemaker transmission.   

## 2021-02-26 ENCOUNTER — Other Ambulatory Visit: Payer: 59

## 2021-03-02 ENCOUNTER — Telehealth: Payer: Self-pay | Admitting: *Deleted

## 2021-03-03 NOTE — Telephone Encounter (Signed)
Patient  appointment cancelled with heartcare will get at another facility per wife

## 2021-03-27 ENCOUNTER — Inpatient Hospital Stay: Admission: RE | Admit: 2021-03-27 | Payer: 59 | Source: Ambulatory Visit

## 2021-08-17 ENCOUNTER — Other Ambulatory Visit: Payer: Self-pay

## 2021-08-17 ENCOUNTER — Ambulatory Visit (INDEPENDENT_AMBULATORY_CARE_PROVIDER_SITE_OTHER): Payer: 59 | Admitting: Internal Medicine

## 2021-08-17 VITALS — BP 116/82 | HR 110 | Ht 72.0 in | Wt 214.0 lb

## 2021-08-17 DIAGNOSIS — I493 Ventricular premature depolarization: Secondary | ICD-10-CM | POA: Diagnosis not present

## 2021-08-17 DIAGNOSIS — G901 Familial dysautonomia [Riley-Day]: Secondary | ICD-10-CM | POA: Diagnosis not present

## 2021-08-17 DIAGNOSIS — I77810 Thoracic aortic ectasia: Secondary | ICD-10-CM

## 2021-08-17 DIAGNOSIS — G4733 Obstructive sleep apnea (adult) (pediatric): Secondary | ICD-10-CM | POA: Diagnosis not present

## 2021-08-17 DIAGNOSIS — R001 Bradycardia, unspecified: Secondary | ICD-10-CM

## 2021-08-17 MED ORDER — PAROXETINE HCL 20 MG PO TABS: ORAL_TABLET | ORAL | 3 refills | Status: DC

## 2021-08-17 NOTE — Progress Notes (Signed)
PCP: Orpah Melter, MD   Primary EP:  Dr Rayann Heman  Tracy [name] is a 65 y.o. male who presents today for routine electrophysiology followup.  Since last being seen in our clinic, the patient reports doing very well.  Today, he denies symptoms of palpitations, chest pain, shortness of breath,  lower extremity edema, dizziness, presyncope, or syncope.  The patient is otherwise without complaint today.   Past Medical History:  Diagnosis Date   Bradycardia 01/01/03   s/p PPM (MDT)   Cancer ([facility])    colon cancer   Dyslipidemia    Myopia    OSA (obstructive sleep apnea) 07/21/2011   NPSG 2012:  AHI 88/hr with both obstructive and central events.  Auto 10/2013:  Optimal pressure 16cm.     Presence of permanent cardiac pacemaker    due to node dysfunction   Sleep apnea 9/12   severe   Syncope 2008   dysautonomia   Past Surgical History:  Procedure Laterality Date   CARDIAC CATHETERIZATION  2008   no CAD   HERNIA REPAIR Left    inguinal hernia   LAPAROSCOPIC PARTIAL COLECTOMY N/A 09/01/2018   Procedure: LAPAROSCOPIC ASSISTED PARTIAL COLECTOMY;  Surgeon: Coralie Keens, MD;  Location: [place];  Service: General;  Laterality: N/A;   MENISCUS REPAIR Left    PACEMAKER PLACEMENT  01/01/03   generator change (MDT) by Dr Rayann Heman 2012   PARTIAL COLECTOMY  09/01/2018    ROS- all systems are reviewed and negative except as per HPI above  Current Outpatient Medications  Medication Sig Dispense Refill   ibuprofen (ADVIL,MOTRIN) 200 MG tablet Take 400 mg by mouth every 8 (eight) hours as needed (for pain.).     loperamide (IMODIUM) 2 MG capsule Take 2 mg by mouth daily.     metoprolol succinate (TOPROL-XL) 50 MG 24 hr tablet TAKE 1 TABLET(50 MG) BY MOUTH DAILY. 30 tablet 11   PARoxetine (PAXIL) 20 MG tablet TAKE 1 TABLET(20 MG) BY MOUTH DAILY 90 tablet 3   atorvastatin (LIPITOR) 20 MG tablet Take 1 tablet (20 mg total) by mouth daily. 90 tablet 3   No current facility-administered  medications for this visit.    Physical Exam: Vitals:   08/17/21 1052  BP: 116/82  Pulse: (!) 110  SpO2: 96%  Weight: 214 lb (97.1 kg)  Height: 6' (1.829 m)    GEN- The patient is well appearing, alert and oriented x 3 today.   Head- normocephalic, atraumatic Eyes-  Sclera clear, conjunctiva pink Ears- hearing intact Oropharynx- clear Lungs- Clear to ausculation bilaterally, normal work of breathing Chest- pacemaker pocket is well healed Heart- Regular rate and rhythm, no murmurs, rubs or gallops, PMI not laterally displaced GI- soft, NT, ND, + BS Extremities- no clubbing, cyanosis, or edema  Pacemaker interrogation- reviewed in detail today,  See PACEART report  ekg tracing ordered today is personally reviewed and shows sinus tachycardia  Assessment and Plan:  1. Symptomatic sinus bradycardia  Normal pacemaker function See Pace Art report No changes today he is not device dependant today Histograms reflect frequent rate drop pacing at 110 bpm.  This is chronic.  I will make no changes today We will order him a new remote transmitter as his has not been working for quite some time We treat his prior dysautonomia/ syncope with paxil also.  We fill this for him annually  2. Dilated ascending aorta CT is pending for tomorrow  3. HL Lipids 02/11/21 reviewed No changes advised  4. PVCs Stable No change required today  Return to see EP APP annually  Thompson Grayer MD, [facility] 08/17/2021 11:11 AM

## 2021-08-17 NOTE — Patient Instructions (Addendum)
Medication Instructions:  Your physician recommends that you continue on your current medications as directed. Please refer to the Current Medication list given to you today. *If you need a refill on your cardiac medications before your next appointment, please call your pharmacy*  Lab Work: None. If you have labs (blood work) drawn today and your tests are completely normal, you will receive your results only by: [place] (if you have MyChart) OR A paper copy in the mail If you have any lab test that is abnormal or we need to change your treatment, we will call you to review the results.  Testing/Procedures: None.  Follow-Up: At [facility], you and your health needs are our priority.  As part of our continuing mission to provide you with exceptional heart care, we have created designated Provider Care Teams.  These Care Teams include your primary Cardiologist (physician) and Advanced Practice Providers (APPs -  Physician Assistants and Nurse Practitioners) who all work together to provide you with the care you need, when you need it.  Your physician wants you to follow-up in: 12 months with  Thompson Grayer, MD or one of the following Advanced Practice Providers on your designated Care Team:    Tommye Standard, PA-C   You will receive a reminder letter in the mail two months in advance. If you don't receive a letter, please call our office to schedule the follow-up appointment.  Remote monitoring is used to monitor your Pacemaker from home. This monitoring reduces the number of office visits required to check your device to one time per year. It allows [place] to keep an eye on the functioning of your device to ensure it is working properly. You are scheduled for a device check from home on 11/06/21. You may send your transmission at any time that day. If you have a wireless device, the transmission will be sent automatically. After your physician reviews your transmission, you will receive a  postcard with your next transmission date.  We recommend signing up for the patient portal called "MyChart".  Sign up information is provided on this After Visit Summary.  MyChart is used to connect with patients for Virtual Visits (Telemedicine).  Patients are able to view lab/test results, encounter notes, upcoming appointments, etc.  Non-urgent messages can be sent to your provider as well.   To learn more about what you can do with MyChart, go to [email or web address].    Any Other Special Instructions Will Be Listed Below (If Applicable).

## 2021-08-18 ENCOUNTER — Inpatient Hospital Stay: Payer: 59 | Attending: Oncology

## 2021-08-18 ENCOUNTER — Ambulatory Visit (HOSPITAL_BASED_OUTPATIENT_CLINIC_OR_DEPARTMENT_OTHER)
Admission: RE | Admit: 2021-08-18 | Discharge: 2021-08-18 | Disposition: A | Discharge status: Home / Self Care | Payer: 59 | Source: Ambulatory Visit | Attending: Oncology | Admitting: Oncology

## 2021-08-18 ENCOUNTER — Encounter (HOSPITAL_BASED_OUTPATIENT_CLINIC_OR_DEPARTMENT_OTHER): Payer: Self-pay

## 2021-08-18 DIAGNOSIS — G4730 Sleep apnea, unspecified: Secondary | ICD-10-CM | POA: Diagnosis not present

## 2021-08-18 DIAGNOSIS — D696 Thrombocytopenia, unspecified: Secondary | ICD-10-CM | POA: Insufficient documentation

## 2021-08-18 DIAGNOSIS — Z85038 Personal history of other malignant neoplasm of large intestine: Secondary | ICD-10-CM | POA: Diagnosis present

## 2021-08-18 DIAGNOSIS — C182 Malignant neoplasm of ascending colon: Secondary | ICD-10-CM | POA: Insufficient documentation

## 2021-08-18 DIAGNOSIS — I499 Cardiac arrhythmia, unspecified: Secondary | ICD-10-CM | POA: Insufficient documentation

## 2021-08-18 LAB — BASIC METABOLIC PANEL - CANCER CENTER ONLY
Anion gap: 8 (ref 5–15)
BUN: 11 mg/dL (ref 8–23)
CO2: 28 mmol/L (ref 22–32)
Calcium: 9.4 mg/dL (ref 8.9–10.3)
Chloride: 102 mmol/L (ref 98–111)
Creatinine: 0.78 mg/dL (ref 0.61–1.24)
GFR, Estimated: >60 mL/min (ref >60)
Glucose, Bld: 114 mg/dL — ABNORMAL HIGH (ref 70–99)
Potassium: 4.1 mmol/L (ref 3.5–5.1)
Sodium: 138 mmol/L (ref 135–145)

## 2021-08-18 LAB — CEA (ACCESS): CEA (CHCC): 2.48 ng/mL (ref 0.00–5.00)

## 2021-08-18 LAB — POCT I-STAT CREATININE: Creatinine, Ser: 0.9 mg/dL (ref 0.61–1.24)

## 2021-08-18 MED ORDER — IOHEXOL 300 MG/ML  SOLN
100.0000 mL | Freq: Once | INTRAMUSCULAR | Status: AC | PRN
  Administered 2021-08-18: 100 mL via INTRAVENOUS

## 2021-08-19 LAB — CEA (IN HOUSE-CHCC): CEA (CHCC-In House): 2.09 ng/mL (ref 0.00–5.00)

## 2021-08-20 ENCOUNTER — Inpatient Hospital Stay (HOSPITAL_BASED_OUTPATIENT_CLINIC_OR_DEPARTMENT_OTHER): Payer: 59 | Admitting: Oncology

## 2021-08-20 ENCOUNTER — Other Ambulatory Visit: Payer: Self-pay

## 2021-08-20 ENCOUNTER — Other Ambulatory Visit (HOSPITAL_BASED_OUTPATIENT_CLINIC_OR_DEPARTMENT_OTHER): Payer: Self-pay

## 2021-08-20 VITALS — BP 127/88 | HR 100 | Temp 97.8°F | Resp 18 | Ht 72.0 in | Wt 214.0 lb

## 2021-08-20 DIAGNOSIS — C182 Malignant neoplasm of ascending colon: Secondary | ICD-10-CM

## 2021-08-20 DIAGNOSIS — Z85038 Personal history of other malignant neoplasm of large intestine: Secondary | ICD-10-CM | POA: Diagnosis not present

## 2021-08-20 MED ORDER — INFLUENZA VAC SPLIT QUAD 0.5 ML IM SUSY
PREFILLED_SYRINGE | INTRAMUSCULAR | 0 refills | Status: DC
  Filled 2021-08-20: qty 0.5, 1d supply, fill #0

## 2021-08-20 NOTE — Progress Notes (Signed)
[facility] OFFICE PROGRESS NOTE   Diagnosis: Colon cancer  INTERVAL HISTORY:   [name] returns as scheduled.  He feels well.  No complaint.  Objective:  Vital signs in last 24 hours:  Blood pressure 127/88, pulse 100, temperature 97.8 F (36.6 C), temperature source Oral, resp. rate 18, height 6' (1.829 m), weight 214 lb (97.1 kg), SpO2 98 %.    Lymphatics: No cervical, supraclavicular, axillary, or inguinal nodes Resp: Lungs clear bilaterally Cardio: Regular rate and rhythm GI: No hepatosplenomegaly, nontender, no mass Vascular: No leg edema the left lower leg is slightly larger than the right side  Lab Results:  Lab Results  Component Value Date   WBC 8.4 08/14/2020   HGB 14.5 08/14/2020   HCT 43.7 08/14/2020   MCV 95.4 08/14/2020   PLT 193 08/14/2020   NEUTROABS 5.7 08/14/2020    CMP  Lab Results  Component Value Date   NA 138 08/18/2021   K 4.1 08/18/2021   CL 102 08/18/2021   CO2 28 08/18/2021   GLUCOSE 114 (H) 08/18/2021   BUN 11 08/18/2021   CREATININE 0.90 08/18/2021   CALCIUM 9.4 08/18/2021   PROT 7.3 02/10/2021   ALBUMIN 4.7 02/10/2021   AST 21 02/10/2021   ALT 23 02/10/2021   ALKPHOS 74 02/10/2021   BILITOT 0.5 02/10/2021   GFRNONAA >60 08/18/2021   GFRAA 105 09/08/2020    Lab Results  Component Value Date   CEA1 2.09 08/18/2021   CEA 2.48 08/18/2021      Imaging:  CT CHEST ABDOMEN PELVIS W CONTRAST  Result Date: 08/19/2021 CLINICAL DATA:  Neoplasm of the ascending colon.  Restaging. EXAM: CT CHEST, ABDOMEN, AND PELVIS WITH CONTRAST TECHNIQUE: Multidetector CT imaging of the chest, abdomen and pelvis was performed following the standard protocol during bolus administration of intravenous contrast. CONTRAST:  119m OMNIPAQUE IOHEXOL 300 MG/ML  SOLN COMPARISON:  09/16/2020 FINDINGS: CT CHEST FINDINGS Cardiovascular: The heart size is normal. No substantial pericardial effusion. Coronary artery calcification is  evident. Ascending thoracic aorta is 4.4 cm diameter. Right-sided permanent pacemaker noted. Mediastinum/Nodes: No mediastinal lymphadenopathy. There is no hilar lymphadenopathy. The esophagus has normal imaging features. There is no axillary lymphadenopathy. Lungs/Pleura: 5 mm left upper lobe nodule on 75/4 is stable in the interval. No suspicious pulmonary nodule or mass. No focal airspace consolidation. No pleural effusion. Musculoskeletal: No worrisome lytic or sclerotic osseous abnormality. CT ABDOMEN PELVIS FINDINGS Hepatobiliary: No suspicious focal abnormality within the liver parenchyma. There is no evidence for gallstones, gallbladder wall thickening, or pericholecystic fluid. No intrahepatic or extrahepatic biliary dilation. Pancreas: No focal mass lesion. No dilatation of the main duct. No intraparenchymal cyst. No peripancreatic edema. Spleen: No splenomegaly. No focal mass lesion. Adrenals/Urinary Tract: No adrenal nodule or mass. Kidneys unremarkable. No evidence for hydroureter. The urinary bladder appears normal for the degree of distention. Stomach/Bowel: Stomach is unremarkable. No gastric wall thickening. No evidence of outlet obstruction. Duodenum is normally positioned as is the ligament of Treitz. No small bowel wall thickening. No small bowel dilatation. Status post right hemicolectomy No gross colonic mass. No colonic wall thickening. A few scattered diverticuli are noted in the left colon. Vascular/Lymphatic: There is mild atherosclerotic calcification of the abdominal aorta without aneurysm. There is no gastrohepatic or hepatoduodenal ligament lymphadenopathy. No retroperitoneal or mesenteric lymphadenopathy. No pelvic sidewall lymphadenopathy. Reproductive: The prostate gland and seminal vesicles are unremarkable. Other: Subtle bandlike soft tissue attenuation in the right upper quadrant adjacent to the gallbladder and  ileocolic anastomosis is less prominent than on the previous study,  likely resolving postsurgical change. No intraperitoneal free fluid. Musculoskeletal: No worrisome lytic or sclerotic osseous abnormality. IMPRESSION: 1. Stable exam. No new or progressive findings to suggest recurrent or metastatic disease. 2. Status post right hemicolectomy. 3. Stable 5 mm left upper lobe pulmonary nodule. Almost assuredly benign. Continued attention on follow-up recommended. 4. Aortic Atherosclerosis (ICD10-I70.0). Electronically Signed   By: Misty Stanley M.D.   On: 08/19/2021 10:05    Medications: I have reviewed the patient's current medications.   Assessment/Plan: Adenocarcinoma the cecum, stage IIb (T4aN0), status post a right colectomy 09/01/2018 0/14 lymph nodes positive, perineural invasion present, no macroscopic tumor perforation, no lymphovascular invasion MSI-stable, no loss of mismatch repair protein expression Colonoscopy 08/08/2018- ulcerated partially obstructing mass at the "hepatic flexure "-biopsy revealed invasive well-differentiated adenocarcinoma, no loss of mismatch repair protein expression, 6 mm polyp in the proximal transverse colon-not removed CTs 08/16/2018- cecal mass, no evidence of metastatic disease Cycle 1 CAPOX 10/09/2018 Cycle 2 CAPOX 10/30/2018 Cycle 3 CAPOX 11/20/2018 Cycle 4 CAPOX 12/11/2018 Surveillance CT scans 08/22/2019-no definitive evidence of metastatic disease.  Stable 4 mm lingular nodule. Surveillance colonoscopy November 2020 CTs 09/16/2020-no metastatic disease CTs 08/18/2021-no evidence of recurrent disease, stable 5 mm left upper lobe nodule   Arrhythmia-status post permanent pacemaker placement Microcytic anemia secondary to #1- resolved Sleep apnea Mild thrombocytopenia secondary to chemotherapy-resolved     Disposition: Tracy [name] is in clinical remission from colon cancer.  He will return for an office visit and CEA in 6 months.  Betsy Coder, MD  08/20/2021  9:38 AM

## 2021-10-26 ENCOUNTER — Telehealth: Payer: Self-pay | Admitting: Internal Medicine

## 2021-10-26 NOTE — Telephone Encounter (Signed)
Pt would like to switch to Dr. Caryl Comes once Dr. Sharen Hint, would like to know if this is ok w/ Dr. Caryl Comes... please advise

## 2021-10-26 NOTE — Telephone Encounter (Signed)
I think it would be good if he followed with Dr Caryl Comes.

## 2021-11-04 NOTE — Telephone Encounter (Signed)
My pleasure.

## 2022-02-02 ENCOUNTER — Other Ambulatory Visit: Payer: Self-pay | Admitting: Internal Medicine

## 2022-02-02 MED ORDER — ATORVASTATIN CALCIUM 20 MG PO TABS: 20.0000 mg | ORAL_TABLET | Freq: Every day | ORAL | 3 refills | Status: DC

## 2022-02-02 MED ORDER — ATORVASTATIN CALCIUM 20 MG PO TABS: 20.0000 mg | ORAL_TABLET | Freq: Every day | ORAL | 1 refills | Status: DC

## 2022-02-02 NOTE — Addendum Note (Signed)
Addended by: Carter Kitten D on: 02/02/2022 07:42 AM ? ? Modules accepted: Orders ? ?

## 2022-02-18 ENCOUNTER — Inpatient Hospital Stay: Payer: 59 | Attending: Oncology

## 2022-02-18 ENCOUNTER — Inpatient Hospital Stay (HOSPITAL_BASED_OUTPATIENT_CLINIC_OR_DEPARTMENT_OTHER): Payer: 59 | Admitting: Oncology

## 2022-02-18 VITALS — BP 111/70 | HR 60 | Temp 98.1°F | Resp 18 | Ht 73.0 in | Wt 208.2 lb

## 2022-02-18 DIAGNOSIS — D6959 Other secondary thrombocytopenia: Secondary | ICD-10-CM | POA: Insufficient documentation

## 2022-02-18 DIAGNOSIS — C182 Malignant neoplasm of ascending colon: Secondary | ICD-10-CM | POA: Diagnosis not present

## 2022-02-18 DIAGNOSIS — I499 Cardiac arrhythmia, unspecified: Secondary | ICD-10-CM | POA: Diagnosis not present

## 2022-02-18 DIAGNOSIS — D509 Iron deficiency anemia, unspecified: Secondary | ICD-10-CM | POA: Insufficient documentation

## 2022-02-18 DIAGNOSIS — G4730 Sleep apnea, unspecified: Secondary | ICD-10-CM | POA: Insufficient documentation

## 2022-02-18 DIAGNOSIS — Z85048 Personal history of other malignant neoplasm of rectum, rectosigmoid junction, and anus: Secondary | ICD-10-CM | POA: Insufficient documentation

## 2022-02-18 LAB — CEA (ACCESS): CEA (CHCC): 2.52 ng/mL (ref 0.00–5.00)

## 2022-02-18 NOTE — Progress Notes (Signed)
?  [facility] ?OFFICE PROGRESS NOTE ? ? ?Diagnosis: Colon cancer ? ?INTERVAL HISTORY:  ? ?Tracy [name] returns as scheduled.  He feels well.  Good appetite.  No difficulty with bowel function.  No bleeding. ? ?Objective: ? ?Vital signs in last 24 hours: ? ?Blood pressure 111/70, pulse 60, temperature 98.1 ?F (36.7 ?C), temperature source Oral, resp. rate 18, height $RemoveBe'6\' 1"'cEaLvAHhp$  (1.854 m), weight 208 lb 3.2 oz (94.4 kg), SpO2 96 %. ?  ? ? ?Lymphatics: No cervical, supraclavicular, axillary, or inguinal nodes ?Resp: Lungs clear bilaterally ?Cardio: Regular rate and rhythm ?GI: No hepatosplenomegaly, no mass, nontender ?Vascular: No leg edema, left lower leg is slightly larger than the right side ?  ? ? ?Lab Results: ? ?Lab Results  ?Component Value Date  ? WBC 8.4 08/14/2020  ? HGB 14.5 08/14/2020  ? HCT 43.7 08/14/2020  ? MCV 95.4 08/14/2020  ? PLT 193 08/14/2020  ? NEUTROABS 5.7 08/14/2020  ? ? ?CMP  ?Lab Results  ?Component Value Date  ? NA 138 08/18/2021  ? K 4.1 08/18/2021  ? CL 102 08/18/2021  ? CO2 28 08/18/2021  ? GLUCOSE 114 (H) 08/18/2021  ? BUN 11 08/18/2021  ? CREATININE 0.90 08/18/2021  ? CALCIUM 9.4 08/18/2021  ? PROT 7.3 02/10/2021  ? ALBUMIN 4.7 02/10/2021  ? AST 21 02/10/2021  ? ALT 23 02/10/2021  ? ALKPHOS 74 02/10/2021  ? BILITOT 0.5 02/10/2021  ? GFRNONAA >60 08/18/2021  ? GFRAA 105 09/08/2020  ? ? ?Lab Results  ?Component Value Date  ? CEA1 2.09 08/18/2021  ? CEA 2.48 08/18/2021  ? ? ?Medications: I have reviewed the patient's current medications. ? ? ?Assessment/Plan: ?Adenocarcinoma the cecum, stage IIb (T4aN0), status post a right colectomy 09/01/2018 ?0/14 lymph nodes positive, perineural invasion present, no macroscopic tumor perforation, no lymphovascular invasion ?MSI-stable, no loss of mismatch repair protein expression ?Colonoscopy 08/08/2018- ulcerated partially obstructing mass at the "hepatic flexure "-biopsy revealed invasive well-differentiated adenocarcinoma, no loss of  mismatch repair protein expression, 6 mm polyp in the proximal transverse colon-not removed ?CTs 08/16/2018- cecal mass, no evidence of metastatic disease ?Cycle 1 CAPOX 10/09/2018 ?Cycle 2 CAPOX 10/30/2018 ?Cycle 3 CAPOX 11/20/2018 ?Cycle 4 CAPOX 12/11/2018 ?Surveillance CT scans 08/22/2019-no definitive evidence of metastatic disease.  Stable 4 mm lingular nodule. ?Surveillance colonoscopy November 2020 ?CTs 09/16/2020-no metastatic disease ?CTs 08/18/2021-no evidence of recurrent disease, stable 5 mm left upper lobe nodule ?  ?Arrhythmia-status post permanent pacemaker placement ?Microcytic anemia secondary to #1- resolved ?Sleep apnea ?Mild thrombocytopenia secondary to chemotherapy-resolved ? ? ?Disposition: ?Tracy [name] is in clinical remission from colon cancer.  We will follow-up on the CEA from today.  He will be due for a surveillance colonoscopy in November.  I made a referral to Dr. Paulita Fujita.  Tracy [name] will return for an office visit in 6 months. ? ?Betsy Coder, MD ? ?02/18/2022  ?8:23 AM ? ? ?

## 2022-02-19 ENCOUNTER — Telehealth: Payer: Self-pay

## 2022-02-19 NOTE — Telephone Encounter (Signed)
-----   Message from Ladell Pier, MD sent at 02/18/2022  4:47 PM EDT ----- ?Please call patient, CEA is normal, follow-up as scheduled ? ?

## 2022-02-19 NOTE — Telephone Encounter (Signed)
Pt verbalized understanding.

## 2022-02-23 ENCOUNTER — Encounter: Payer: Self-pay | Admitting: *Deleted

## 2022-02-23 NOTE — Progress Notes (Signed)
Faxed referral order, office note and recent CEA to [facility] for need for colonoscopy in November 2023 ?

## 2022-02-24 NOTE — Progress Notes (Signed)
Received fax from [facility] that patient's account is inactive and he needs to call [facility] and that patient is aware. ?

## 2022-06-27 NOTE — Progress Notes (Unsigned)
Cardiology Office Note Date:  06/27/2022  Patient ID:  Tracy [name], Tracy [name] 1956/10/10, MRN [id number] PCP:  Orpah Melter, MD  Electrophysiologist: Dr. Rayann Heman     Chief Complaint:  annual visit  History of Present Illness: Tracy [name] is a 66 y.o. male with history of symptomatic bradycardia w/PPM, PVCs, OSA (severe) w/CPAP, dysautonomia, dilated ascending Ao, colon cancer (s/p hemicolectomy), HLD  He saw  Dr. Rayann Heman, via tele health visit feb 2021.  He was doing well, compliant with CPAP, planned for CT/echo to follow up on his Ao Oct 2021.  Resumed on remote schedule.  CT noted stable aorta and recommended semi-annual imaging  I saw him April 2022 He is doing well L knee is quite painful and his R knee starting to give him trouble, this limits his ability to exercise. He is active though and denies any difficulties or symptoms with his ADLs No CP, palpitations or cardiac awareness No dizzy spells, near syncope or syncope. No SOB, DOE He does not follow with his PMD, pt and wife report that Dr. Rayann Heman has taken care of his medicines and labs  Sees his oncologist Advised he would need to get a PMD going forward, Dr. Rayann Heman had been rx his Paxil and was refilled but urged to get a PMD for this going forward.  He saw Dr. Rayann Heman Oct 2022, doing well, noted Histograms reflect frequent rate drop pacing at 110 bpm.  This was chronic and no changed made. New home transmitted ordered for him  CT ao was already scheduled for him. Recommended EP PP annual visits  *** PMD *** symptoms *** new EP will be SK, next    Device information MDT dual chamber PPM implanted 2004, gen change 07/27/2011   Past Medical History:  Diagnosis Date   Bradycardia 01/01/03   s/p PPM (MDT)   Cancer ([place])    colon cancer   Dyslipidemia    Myopia    OSA (obstructive sleep apnea) 07/21/2011   NPSG 2012:  AHI 88/hr with both obstructive and central events.  Auto 10/2013:  Optimal pressure 16cm.      Presence of permanent cardiac pacemaker    due to node dysfunction   Sleep apnea 9/12   severe   Syncope 2008   dysautonomia    Past Surgical History:  Procedure Laterality Date   CARDIAC CATHETERIZATION  2008   no CAD   HERNIA REPAIR Left    inguinal hernia   LAPAROSCOPIC PARTIAL COLECTOMY N/A 09/01/2018   Procedure: LAPAROSCOPIC ASSISTED PARTIAL COLECTOMY;  Surgeon: Coralie Keens, MD;  Location: [facility];  Service: General;  Laterality: N/A;   MENISCUS REPAIR Left    PACEMAKER PLACEMENT  01/01/03   generator change (MDT) by Dr Rayann Heman 2012   PARTIAL COLECTOMY  09/01/2018    Current Outpatient Medications  Medication Sig Dispense Refill   atorvastatin (LIPITOR) 20 MG tablet Take 1 tablet (20 mg total) by mouth daily. 90 tablet 1   ibuprofen (ADVIL,MOTRIN) 200 MG tablet Take 400 mg by mouth every 8 (eight) hours as needed (for pain.).     loperamide (IMODIUM) 2 MG capsule Take 2 mg by mouth 2 (two) times daily.     metoprolol succinate (TOPROL-XL) 50 MG 24 hr tablet TAKE 1 TABLET(50 MG) BY MOUTH DAILY. 90 tablet 3   PARoxetine (PAXIL) 20 MG tablet TAKE 1 TABLET(20 MG) BY MOUTH DAILY 90 tablet 3   No current facility-administered medications for this visit.    Allergies:   Patient  has no known allergies.   Social History:  The patient  reports that he quit smoking about 3 years ago. His smoking use included cigars. He has never used smokeless tobacco. He reports that he does not currently use alcohol. He reports that he does not use drugs.   Family History:  The patient's family history includes Heart disease in an other family member.  ROS:  Please see the history of present illness.    All other systems are reviewed and otherwise negative.   PHYSICAL EXAM:  VS:  There were no vitals taken for this visit. BMI: There is no height or weight on file to calculate BMI. Well nourished, well developed, in no acute distress HEENT: normocephalic, atraumatic Neck: no JVD, carotid  bruits or masses Cardiac:  *** RRR; no significant murmurs, no rubs, or gallops Lungs:  *** CTA b/l, no wheezing, rhonchi or rales Abd: soft, nontender MS: no deformity or atrophy Ext: *** no edema Skin: warm and dry, no rash Neuro:  No gross deficits appreciated Psych: euthymic mood, full affect  *** PPM site (R side) is stable, no tethering or discomfort   EKG:  Done today and reviewed by myself shows  ***  Device interrogation done today and reviewed by myself:  ***   08/18/2021: CT chest/abdomen Cardiovascular: The heart size is normal. No substantial pericardial effusion. Coronary artery calcification is evident. Ascending thoracic aorta is 4.4 cm diameter. Right-sided permanent pacemaker noted IMPRESSION: 1. Stable exam. No new or progressive findings to suggest recurrent or metastatic disease. 2. Status post right hemicolectomy. 3. Stable 5 mm left upper lobe pulmonary nodule. Almost assuredly benign. Continued attention on follow-up recommended. 4. Aortic Atherosclerosis (ICD10-I70.0).   09/16/2020: TTE IMPRESSIONS   1. Moderately dilated ascending aorta with maximum diameter 45 mm, a  chest CTA is recommended for further evaluation.   2. Left ventricular ejection fraction, by estimation, is 60 to 65%. The  left ventricle has normal function. The left ventricle has no regional  wall motion abnormalities. The left ventricular internal cavity size was  mildly dilated. Left ventricular  diastolic parameters are consistent with Grade I diastolic dysfunction  (impaired relaxation). The average left ventricular global longitudinal  strain is -22.7 %. The global longitudinal strain is normal.   3. Right ventricular systolic function is normal. The right ventricular  size is normal. There is normal pulmonary artery systolic pressure.   4. Left atrial size was moderately dilated.   5. The mitral valve is normal in structure. No evidence of mitral valve  regurgitation.  No evidence of mitral stenosis.   6. The aortic valve is normal in structure. Aortic valve regurgitation is  not visualized. No aortic stenosis is present.   7. Aortic dilatation noted. There is moderate dilatation of the ascending  aorta, measuring 45 mm.   8. The inferior vena cava is normal in size with greater than 50%  respiratory variability, suggesting right atrial pressure of 3 mmHg.    09/16/20: CT angio chest IMPRESSION: 1. Stable uncomplicated fusiform aneurysmal dilatation of the ascending thoracic aorta measuring 45 mm in diameter, unchanged compared to the 08/2018 examination. Recommend semi-annual imaging followup by CTA or MRA and referral to cardiothoracic surgery if not already obtained. This recommendation follows 2010 ACCF/AHA/AATS/ACR/ASA/SCA/SCAI/SIR/STS/SVM Guidelines for the Diagnosis and Management of Patients With Thoracic Aortic Disease. Circulation. 2010; 121: J242-A834. Aortic aneurysm NOS (ICD10-I71.9) 2. Coronary artery calcifications. Aortic Atherosclerosis (ICD10-I70.0). 3. Please refer to separate dictation regarding dedicated CT of the abdomen  pelvis performed earlier same day.    Recent Labs: 08/18/2021: BUN 11; Creatinine, Ser 0.90; Potassium 4.1; Sodium 138  No results found for requested labs within last 365 days.   CrCl cannot be calculated (Patient's most recent lab result is older than the maximum 21 days allowed.).   Wt Readings from Last 3 Encounters:  02/18/22 208 lb 3.2 oz (94.4 kg)  08/20/21 214 lb (97.1 kg)  08/17/21 214 lb (97.1 kg)     Other studies reviewed: Additional studies/records reviewed today include: summarized above  ASSESSMENT AND PLAN:  1. PPM     *** Intact function, no programming changes made  2. dilated ascending Ao     *** Stable by last imaging     Semi-annual CTs recommended     *** Plan for CT  3. PVCs     Preserved LVEF by echo Nov 2021     *** No symptoms to suggest clinical change  4. HLD     ***  5. Chronically on Paxil    ***  Dr. Rayann Heman has been filling          *** Discussed importance of PMD for management of non-cardiac screening needs, or medial issues that come up.  The patient and wife report his prior EP functioned as his PMD and so far Dr. Rayann Heman has filled his medicines.    Disposition: ***  Current medicines are reviewed at length with the patient today.  The patient did not have any concerns regarding medicines.  Venetia Night, PA-C 06/27/2022 8:03 AM     [address] [place]  [place] [address] [phone] (office)  [phone] (fax)

## 2022-06-30 ENCOUNTER — Encounter: Payer: Self-pay | Admitting: Physician Assistant

## 2022-06-30 ENCOUNTER — Ambulatory Visit (INDEPENDENT_AMBULATORY_CARE_PROVIDER_SITE_OTHER): Payer: 59 | Admitting: Physician Assistant

## 2022-06-30 VITALS — BP 118/62 | HR 64 | Ht 72.0 in | Wt 202.0 lb

## 2022-06-30 DIAGNOSIS — Z950 Presence of cardiac pacemaker: Secondary | ICD-10-CM

## 2022-06-30 DIAGNOSIS — Z79899 Other long term (current) drug therapy: Secondary | ICD-10-CM

## 2022-06-30 DIAGNOSIS — E785 Hyperlipidemia, unspecified: Secondary | ICD-10-CM

## 2022-06-30 DIAGNOSIS — I493 Ventricular premature depolarization: Secondary | ICD-10-CM

## 2022-06-30 LAB — CUP PACEART INCLINIC DEVICE CHECK
Battery Impedance: 1797 Ohm
Battery Remaining Longevity: 46 mo
Battery Voltage: 2.76 V
Brady Statistic AP VP Percent: 20 %
Brady Statistic AP VS Percent: 0 %
Brady Statistic AS VP Percent: 0 %
Brady Statistic AS VS Percent: 79 %
Date Time Interrogation Session: 20230823111420
Implantable Lead Implant Date: 20040329
Implantable Lead Implant Date: 20040329
Implantable Lead Location: 753859
Implantable Lead Location: 753860
Implantable Lead Model: 4092
Implantable Lead Model: 4592
Implantable Pulse Generator Implant Date: 20120918
Lead Channel Impedance Value: 592 Ohm
Lead Channel Impedance Value: 700 Ohm
Lead Channel Pacing Threshold Amplitude: 0.5 V
Lead Channel Pacing Threshold Amplitude: 1 V
Lead Channel Pacing Threshold Amplitude: 1.25 V
Lead Channel Pacing Threshold Pulse Width: 0.4 ms
Lead Channel Pacing Threshold Pulse Width: 0.4 ms
Lead Channel Pacing Threshold Pulse Width: 0.4 ms
Lead Channel Sensing Intrinsic Amplitude: 4 mV
Lead Channel Sensing Intrinsic Amplitude: 5.6 mV
Lead Channel Setting Pacing Amplitude: 2 V
Lead Channel Setting Pacing Amplitude: 2.5 V
Lead Channel Setting Pacing Pulse Width: 0.4 ms
Lead Channel Setting Sensing Sensitivity: 2 mV

## 2022-06-30 LAB — COMPREHENSIVE METABOLIC PANEL
ALT: 14 IU/L (ref 0–44)
AST: 17 IU/L (ref 0–40)
Albumin/Globulin Ratio: 2.3 — ABNORMAL HIGH (ref 1.2–2.2)
Albumin: 4.8 g/dL (ref 3.9–4.9)
Alkaline Phosphatase: 63 IU/L (ref 44–121)
BUN/Creatinine Ratio: 13 (ref 10–24)
BUN: 12 mg/dL (ref 8–27)
Bilirubin Total: 0.8 mg/dL (ref 0.0–1.2)
CO2: 26 mmol/L (ref 20–29)
Calcium: 9.3 mg/dL (ref 8.6–10.2)
Chloride: 101 mmol/L (ref 96–106)
Creatinine, Ser: 0.89 mg/dL (ref 0.76–1.27)
Globulin, Total: 2.1 g/dL (ref 1.5–4.5)
Glucose: 109 mg/dL — ABNORMAL HIGH (ref 70–99)
Potassium: 4.3 mmol/L (ref 3.5–5.2)
Sodium: 140 mmol/L (ref 134–144)
Total Protein: 6.9 g/dL (ref 6.0–8.5)
eGFR: 95 mL/min/{1.73_m2} (ref >59)

## 2022-06-30 LAB — LIPID PANEL
Chol/HDL Ratio: 2.4 ratio (ref 0.0–5.0)
Cholesterol, Total: 152 mg/dL (ref 100–199)
HDL: 63 mg/dL (ref >39)
LDL Chol Calc (NIH): 76 mg/dL (ref 0–99)
Triglycerides: 67 mg/dL (ref 0–149)
VLDL Cholesterol Cal: 13 mg/dL (ref 5–40)

## 2022-06-30 MED ORDER — PAROXETINE HCL 20 MG PO TABS: ORAL_TABLET | ORAL | 1 refills | Status: AC

## 2022-06-30 MED ORDER — METOPROLOL SUCCINATE ER 50 MG PO TB24: ORAL_TABLET | ORAL | 3 refills | Status: DC

## 2022-06-30 MED ORDER — ATORVASTATIN CALCIUM 20 MG PO TABS: 20.0000 mg | ORAL_TABLET | Freq: Every day | ORAL | 3 refills | Status: DC

## 2022-06-30 NOTE — Patient Instructions (Addendum)
Medication Instructions:    Your physician recommends that you continue on your current medications as directed. Please refer to the Current Medication list given to you today.   *If you need a refill on your cardiac medications before your next appointment, please call your pharmacy*   Lab Work: CMET AND LIPIDS TODAY     If you have labs (blood work) drawn today and your tests are completely normal, you will receive your results only by: [place] (if you have MyChart) OR A paper copy in the mail If you have any lab test that is abnormal or we need to change your treatment, we will call you to review the results.   Testing/Procedures: NONE ORDERED  TODAY   Follow-Up: At [facility], you and your health needs are our priority.  As part of our continuing mission to provide you with exceptional heart care, we have created designated Provider Care Teams.  These Care Teams include your primary Cardiologist (physician) and Advanced Practice Providers (APPs -  Physician Assistants and Nurse Practitioners) who all work together to provide you with the care you need, when you need it.  We recommend signing up for the patient portal called "MyChart".  Sign up information is provided on this After Visit Summary.  MyChart is used to connect with patients for Virtual Visits (Telemedicine).  Patients are able to view lab/test results, encounter notes, upcoming appointments, etc.  Non-urgent messages can be sent to your provider as well.   To learn more about what you can do with MyChart, go to [email or web address].    Your next appointment:   1 year(s)  The format for your next appointment:   In Person  Provider:   Virl Axe, MD         AND   6 months with Tommye Standard    Other Instructions   [place]  [phone] FOR FURTHER ASSISTANCE   Important Information About Sugar

## 2022-08-12 ENCOUNTER — Encounter: Payer: 59 | Admitting: Physician Assistant

## 2022-08-20 ENCOUNTER — Other Ambulatory Visit: Payer: 59

## 2022-08-20 ENCOUNTER — Ambulatory Visit: Payer: 59 | Admitting: Oncology

## 2022-08-28 ENCOUNTER — Other Ambulatory Visit: Payer: Self-pay | Admitting: Physician Assistant

## 2022-08-30 NOTE — Telephone Encounter (Signed)
Pt's pharmacy is requesting a refill on paroxetine. Would Tommye Standard, PA like to refill this medication? Please address

## 2022-09-01 ENCOUNTER — Inpatient Hospital Stay: Payer: Medicare Other | Attending: Oncology | Admitting: Oncology

## 2022-09-01 ENCOUNTER — Inpatient Hospital Stay: Payer: Medicare Other

## 2022-09-01 VITALS — BP 113/75 | HR 65 | Temp 98.2°F | Resp 18 | Ht 72.0 in | Wt 199.2 lb

## 2022-09-01 DIAGNOSIS — Z9049 Acquired absence of other specified parts of digestive tract: Secondary | ICD-10-CM | POA: Diagnosis not present

## 2022-09-01 DIAGNOSIS — D509 Iron deficiency anemia, unspecified: Secondary | ICD-10-CM | POA: Insufficient documentation

## 2022-09-01 DIAGNOSIS — C180 Malignant neoplasm of cecum: Secondary | ICD-10-CM | POA: Diagnosis present

## 2022-09-01 DIAGNOSIS — C182 Malignant neoplasm of ascending colon: Secondary | ICD-10-CM

## 2022-09-01 DIAGNOSIS — D6959 Other secondary thrombocytopenia: Secondary | ICD-10-CM | POA: Insufficient documentation

## 2022-09-01 DIAGNOSIS — G4730 Sleep apnea, unspecified: Secondary | ICD-10-CM | POA: Diagnosis not present

## 2022-09-01 DIAGNOSIS — Z950 Presence of cardiac pacemaker: Secondary | ICD-10-CM | POA: Insufficient documentation

## 2022-09-01 DIAGNOSIS — R911 Solitary pulmonary nodule: Secondary | ICD-10-CM | POA: Insufficient documentation

## 2022-09-01 LAB — CEA (ACCESS): CEA (CHCC): 2.8 ng/mL (ref 0.00–5.00)

## 2022-09-01 NOTE — Progress Notes (Signed)
  [facility] OFFICE PROGRESS NOTE   Diagnosis: Colon cancer  INTERVAL HISTORY:   Tracy [name] returns as scheduled.  He feels well.  Good appetite and energy level.  He relates weight loss to decreased portions.  He continues to have frequent bowel movements, improved with Imodium.  He is scheduled for a colonoscopy in December.  Objective:  Vital signs in last 24 hours:  Blood pressure 113/75, pulse 65, temperature 98.2 F (36.8 C), temperature source Oral, resp. rate 18, height 6' (1.829 m), weight 199 lb 3.2 oz (90.4 kg), SpO2 96 %.    Lymphatics: No cervical, supraclavicular, axillary, or inguinal nodes Resp: Lungs clear bilaterally Cardio: Regular rate and rhythm GI: No hepatosplenomegaly, nontender, no mass Vascular: No leg edema   Lab Results:  Lab Results  Component Value Date   WBC 8.4 08/14/2020   HGB 14.5 08/14/2020   HCT 43.7 08/14/2020   MCV 95.4 08/14/2020   PLT 193 08/14/2020   NEUTROABS 5.7 08/14/2020    CMP  Lab Results  Component Value Date   NA 140 06/30/2022   K 4.3 06/30/2022   CL 101 06/30/2022   CO2 26 06/30/2022   GLUCOSE 109 (H) 06/30/2022   BUN 12 06/30/2022   CREATININE 0.89 06/30/2022   CALCIUM 9.3 06/30/2022   PROT 6.9 06/30/2022   ALBUMIN 4.8 06/30/2022   AST 17 06/30/2022   ALT 14 06/30/2022   ALKPHOS 63 06/30/2022   BILITOT 0.8 06/30/2022   GFRNONAA >60 08/18/2021   GFRAA 105 09/08/2020    Lab Results  Component Value Date   CEA1 2.09 08/18/2021   CEA 2.80 09/01/2022     Medications: I have reviewed the patient's current medications.   Assessment/Plan: Adenocarcinoma the cecum, stage IIb (T4aN0), status post a right colectomy 09/01/2018 0/14 lymph nodes positive, perineural invasion present, no macroscopic tumor perforation, no lymphovascular invasion MSI-stable, no loss of mismatch repair protein expression Colonoscopy 08/08/2018- ulcerated partially obstructing mass at the "hepatic flexure "-biopsy  revealed invasive well-differentiated adenocarcinoma, no loss of mismatch repair protein expression, 6 mm polyp in the proximal transverse colon-not removed CTs 08/16/2018- cecal mass, no evidence of metastatic disease Cycle 1 CAPOX 10/09/2018 Cycle 2 CAPOX 10/30/2018 Cycle 3 CAPOX 11/20/2018 Cycle 4 CAPOX 12/11/2018 Surveillance CT scans 08/22/2019-no definitive evidence of metastatic disease.  Stable 4 mm lingular nodule. Surveillance colonoscopy November 2020 CTs 09/16/2020-no metastatic disease CTs 08/18/2021-no evidence of recurrent disease, stable 5 mm left upper lobe nodule   Arrhythmia-status post permanent pacemaker placement Microcytic anemia secondary to #1- resolved Sleep apnea Mild thrombocytopenia secondary to chemotherapy-resolved     Disposition: Tracy [name] is in clinical remission from colon cancer.  He will return for an office visit and CEA in 6 months.  He is scheduled for surveillance colonoscopy in December.  Betsy Coder, MD  09/01/2022  10:39 AM

## 2022-09-08 ENCOUNTER — Other Ambulatory Visit: Payer: Self-pay | Admitting: *Deleted

## 2022-10-27 DIAGNOSIS — Z85038 Personal history of other malignant neoplasm of large intestine: Secondary | ICD-10-CM | POA: Diagnosis not present

## 2022-10-27 DIAGNOSIS — K529 Noninfective gastroenteritis and colitis, unspecified: Secondary | ICD-10-CM | POA: Diagnosis not present

## 2022-12-08 DIAGNOSIS — Z08 Encounter for follow-up examination after completed treatment for malignant neoplasm: Secondary | ICD-10-CM | POA: Diagnosis not present

## 2022-12-08 DIAGNOSIS — Z85038 Personal history of other malignant neoplasm of large intestine: Secondary | ICD-10-CM | POA: Diagnosis not present

## 2022-12-08 DIAGNOSIS — C20 Malignant neoplasm of rectum: Secondary | ICD-10-CM | POA: Diagnosis not present

## 2022-12-08 DIAGNOSIS — K5730 Diverticulosis of large intestine without perforation or abscess without bleeding: Secondary | ICD-10-CM | POA: Diagnosis not present

## 2022-12-08 DIAGNOSIS — K649 Unspecified hemorrhoids: Secondary | ICD-10-CM | POA: Diagnosis not present

## 2022-12-08 DIAGNOSIS — Z980 Intestinal bypass and anastomosis status: Secondary | ICD-10-CM | POA: Diagnosis not present

## 2022-12-08 LAB — HM COLONOSCOPY

## 2022-12-30 DIAGNOSIS — C189 Malignant neoplasm of colon, unspecified: Secondary | ICD-10-CM | POA: Diagnosis not present

## 2022-12-30 DIAGNOSIS — I77810 Thoracic aortic ectasia: Secondary | ICD-10-CM | POA: Diagnosis not present

## 2022-12-30 DIAGNOSIS — Z0000 Encounter for general adult medical examination without abnormal findings: Secondary | ICD-10-CM | POA: Diagnosis not present

## 2022-12-30 DIAGNOSIS — I700 Atherosclerosis of aorta: Secondary | ICD-10-CM | POA: Diagnosis not present

## 2023-01-14 ENCOUNTER — Telehealth: Payer: Self-pay | Admitting: *Deleted

## 2023-01-14 NOTE — Telephone Encounter (Signed)
Returned call to patient regarding request for appointment. States his colonoscopy on 1/31 w/Dr. Paulita Fujita showed a polyp positive for adenocarcinoma. Having a flex sigmoid on 3/13 and asking if he can be seen the following week? Will get records from [facility] and allow MD to review. Called to HIM w/[facility] to request colonoscopy report/path be faxed.

## 2023-01-19 ENCOUNTER — Telehealth: Payer: Self-pay | Admitting: *Deleted

## 2023-01-19 DIAGNOSIS — D012 Carcinoma in situ of rectum: Secondary | ICD-10-CM | POA: Diagnosis not present

## 2023-01-19 LAB — HM SIGMOIDOSCOPY

## 2023-01-19 NOTE — Telephone Encounter (Signed)
Tracy [name] called requesting appointment soon since colonoscopy showed adenocarcinoma in rectal polyp. Received procedure report and path and both are on MD desk for review. [facility] and requested the MMR results that were pending. Will need to cancel the 4/25 regardless due to going out of country ([place]).

## 2023-01-19 NOTE — Telephone Encounter (Signed)
Informed [name] and his wife that Dr. Benay Spice said at this point medical oncology is not usually seen--next step would be surgical referral. However, if they are anxious and want to discuss he can be seen in late March. Scheduled for 3/28 for lab/OV.

## 2023-01-21 DIAGNOSIS — D012 Carcinoma in situ of rectum: Secondary | ICD-10-CM | POA: Diagnosis not present

## 2023-02-03 ENCOUNTER — Inpatient Hospital Stay: Payer: Medicare Other | Attending: Oncology | Admitting: Oncology

## 2023-02-03 ENCOUNTER — Inpatient Hospital Stay: Payer: Medicare Other

## 2023-02-03 VITALS — BP 108/77 | HR 60 | Temp 98.2°F | Resp 18 | Ht 72.0 in | Wt 199.0 lb

## 2023-02-03 DIAGNOSIS — C182 Malignant neoplasm of ascending colon: Secondary | ICD-10-CM | POA: Diagnosis not present

## 2023-02-03 DIAGNOSIS — G4730 Sleep apnea, unspecified: Secondary | ICD-10-CM | POA: Diagnosis not present

## 2023-02-03 DIAGNOSIS — Z85038 Personal history of other malignant neoplasm of large intestine: Secondary | ICD-10-CM | POA: Diagnosis not present

## 2023-02-03 LAB — CEA (ACCESS): CEA (CHCC): 2.19 ng/mL (ref 0.00–5.00)

## 2023-02-03 NOTE — Progress Notes (Signed)
[facility] OFFICE PROGRESS NOTE   Diagnosis: Colon cancer  INTERVAL HISTORY:   Tracy [name] returns as scheduled.  Feels well.  No difficulty with bowel function.  No bleeding.  Good appetite.  He is walking several days per week for exercise. He underwent a surveillance colonoscopy by Dr. Paulita Fujita in January.  A 12 mm polyp was found in the rectum.  Polyp was removed completely.  The area was tattooed.  The pathology revealed superficially invasive moderately differentiated adenocarcinoma arising from a tubulovillous adenoma.  No angiolymphatic invasion.  The polypectomy margin could not be evaluated due to suboptimal orientation.  No loss of mismatch repair protein expression. He reports undergoing a repeat sigmoidoscopy in February.  He reports no tumor was found on repeat biopsies.  Objective:  Vital signs in last 24 hours:  Blood pressure 108/77, pulse 60, temperature 98.2 F (36.8 C), temperature source Oral, resp. rate 18, height 6' (1.829 m), weight 199 lb (90.3 kg), SpO2 98 %.   Lymphatics: No cervical, supraclavicular, axillary, or Juengel nodes Resp: Lungs clear bilaterally Cardio: Regular rate and rhythm GI: No hepatosplenomegaly, no mass, nontender Vascular: No leg edema  Lab Results:  Lab Results  Component Value Date   WBC 8.4 08/14/2020   HGB 14.5 08/14/2020   HCT 43.7 08/14/2020   MCV 95.4 08/14/2020   PLT 193 08/14/2020   NEUTROABS 5.7 08/14/2020    CMP  Lab Results  Component Value Date   NA 140 06/30/2022   K 4.3 06/30/2022   CL 101 06/30/2022   CO2 26 06/30/2022   GLUCOSE 109 (H) 06/30/2022   BUN 12 06/30/2022   CREATININE 0.89 06/30/2022   CALCIUM 9.3 06/30/2022   PROT 6.9 06/30/2022   ALBUMIN 4.8 06/30/2022   AST 17 06/30/2022   ALT 14 06/30/2022   ALKPHOS 63 06/30/2022   BILITOT 0.8 06/30/2022   GFRNONAA >60 08/18/2021   GFRAA 105 09/08/2020    Lab Results  Component Value Date   CEA1 2.09 08/18/2021   CEA 2.19  02/03/2023     Medications: I have reviewed the patient's current medications.   Assessment/Plan: Adenocarcinoma the cecum, stage IIb (T4aN0), status post a right colectomy 09/01/2018 0/14 lymph nodes positive, perineural invasion present, no macroscopic tumor perforation, no lymphovascular invasion MSI-stable, no loss of mismatch repair protein expression Colonoscopy 08/08/2018- ulcerated partially obstructing mass at the "hepatic flexure "-biopsy revealed invasive well-differentiated adenocarcinoma, no loss of mismatch repair protein expression, 6 mm polyp in the proximal transverse colon-not removed CTs 08/16/2018- cecal mass, no evidence of metastatic disease Cycle 1 CAPOX 10/09/2018 Cycle 2 CAPOX 10/30/2018 Cycle 3 CAPOX 11/20/2018 Cycle 4 CAPOX 12/11/2018 Surveillance CT scans 08/22/2019-no definitive evidence of metastatic disease.  Stable 4 mm lingular nodule. Surveillance colonoscopy November 2020 CTs 09/16/2020-no metastatic disease CTs 08/18/2021-no evidence of recurrent disease, stable 5 mm left upper lobe nodule Surveillance colonoscopy 12/08/2022-rectal polyp with superficially invasive moderately differentiated adenocarcinoma arising from a tubulovillous adenoma, no LVI, polyp completely removed, margin could not be accurately evaluated, no loss of mismatch repair protein expression   Arrhythmia-status post permanent pacemaker placement Microcytic anemia secondary to #1- resolved Sleep apnea Mild thrombocytopenia secondary to chemotherapy-resolved      Disposition: Mr. Bastone is in clinical remission from colon cancer.  He underwent a surveillance colonoscopy in January.  A polyp was removed from the rectum.  The polyp contained superficially invasive adenocarcinoma.  We will follow-up on the sigmoidoscopy and pathology reports from February.  He will continue  colonoscopy surveillance with Dr. Paulita Fujita.  I will present his case at the GI tumor conference within the next few  weeks.  Mr. Elizalde will return for an office visit in 6 months.  Betsy Coder, MD  02/03/2023  8:40 PM

## 2023-02-04 ENCOUNTER — Telehealth: Payer: Self-pay

## 2023-02-04 NOTE — Telephone Encounter (Signed)
-----   Message from Ladell Pier, MD sent at 02/03/2023  5:40 PM EDT ----- Please call patient, the CEA is normal, follow-up as scheduled

## 2023-02-04 NOTE — Telephone Encounter (Signed)
The patient expressed comprehension and did not have any additional questions or concerns at this time.

## 2023-02-23 ENCOUNTER — Other Ambulatory Visit: Payer: Self-pay | Admitting: *Deleted

## 2023-02-23 NOTE — Progress Notes (Signed)
The proposed treatment discussed in conference is for discussion purpose only and is not a binding recommendation.  The patients have not been physically examined, or presented with their treatment options.  Therefore, final treatment plans cannot be decided.  

## 2023-03-03 ENCOUNTER — Ambulatory Visit: Payer: Medicare Other | Admitting: Oncology

## 2023-03-03 ENCOUNTER — Other Ambulatory Visit: Payer: Medicare Other

## 2023-05-09 ENCOUNTER — Ambulatory Visit (INDEPENDENT_AMBULATORY_CARE_PROVIDER_SITE_OTHER): Payer: Medicare Other

## 2023-05-09 DIAGNOSIS — I495 Sick sinus syndrome: Secondary | ICD-10-CM | POA: Diagnosis not present

## 2023-05-10 LAB — CUP PACEART REMOTE DEVICE CHECK
Battery Impedance: 2205 Ohm
Battery Remaining Longevity: 40 mo
Battery Voltage: 2.75 V
Brady Statistic AP VP Percent: 19 %
Brady Statistic AP VS Percent: 0 %
Brady Statistic AS VP Percent: 0 %
Brady Statistic AS VS Percent: 80 %
Date Time Interrogation Session: 20240701070703
Implantable Lead Connection Status: 753985
Implantable Lead Connection Status: 753985
Implantable Lead Implant Date: 20040329
Implantable Lead Implant Date: 20040329
Implantable Lead Location: 753859
Implantable Lead Location: 753860
Implantable Lead Model: 4092
Implantable Lead Model: 4592
Implantable Pulse Generator Implant Date: 20120918
Lead Channel Impedance Value: 613 Ohm
Lead Channel Impedance Value: 719 Ohm
Lead Channel Pacing Threshold Amplitude: 1.375 V
Lead Channel Pacing Threshold Pulse Width: 0.4 ms
Lead Channel Setting Pacing Amplitude: 2 V
Lead Channel Setting Pacing Amplitude: 2.75 V
Lead Channel Setting Pacing Pulse Width: 0.4 ms
Lead Channel Setting Sensing Sensitivity: 2 mV
Zone Setting Status: 755011
Zone Setting Status: 755011

## 2023-05-25 ENCOUNTER — Other Ambulatory Visit: Payer: Self-pay | Admitting: Physician Assistant

## 2023-05-25 ENCOUNTER — Other Ambulatory Visit: Payer: Self-pay

## 2023-05-25 MED ORDER — METOPROLOL SUCCINATE ER 50 MG PO TB24: ORAL_TABLET | ORAL | 0 refills | Status: DC

## 2023-05-30 NOTE — Progress Notes (Signed)
Remote pacemaker transmission.   

## 2023-06-15 DIAGNOSIS — H35371 Puckering of macula, right eye: Secondary | ICD-10-CM | POA: Diagnosis not present

## 2023-06-15 DIAGNOSIS — H40013 Open angle with borderline findings, low risk, bilateral: Secondary | ICD-10-CM | POA: Diagnosis not present

## 2023-06-15 DIAGNOSIS — H2512 Age-related nuclear cataract, left eye: Secondary | ICD-10-CM | POA: Diagnosis not present

## 2023-06-15 DIAGNOSIS — H59811 Chorioretinal scars after surgery for detachment, right eye: Secondary | ICD-10-CM | POA: Diagnosis not present

## 2023-07-26 DIAGNOSIS — M25562 Pain in left knee: Secondary | ICD-10-CM | POA: Diagnosis not present

## 2023-07-29 ENCOUNTER — Telehealth: Payer: Self-pay | Admitting: Physician Assistant

## 2023-07-29 NOTE — Telephone Encounter (Signed)
Pre-operative Risk Assessment    Patient Name: Tracy [name]  DOB: Apr 02, 1956 MRN: [id number]      Request for Surgical Clearance    Procedure:   Left Total Knee Arthroplasty  Date of Surgery:  Clearance TBD                                 Surgeon:  Dr. Jodi Geralds Surgeon's Group or Practice Name:  [facility] Phone number:  [phone]  Fax number:  [phone]   Type of Clearance Requested:   - Medical  - Pharmacy:  Hold patient not on any blood thinners  check by cardiologist for other medication   Type of Anesthesia:  Spinal   Additional requests/questions:  Please fax a copy of medical clearance to the surgeon's office.  Tracy [name]   07/29/2023, 10:30 AM

## 2023-08-01 ENCOUNTER — Other Ambulatory Visit: Payer: Self-pay

## 2023-08-01 MED ORDER — ATORVASTATIN CALCIUM 20 MG PO TABS: 20.0000 mg | ORAL_TABLET | Freq: Every day | ORAL | 0 refills | Status: DC

## 2023-08-01 NOTE — Telephone Encounter (Signed)
Name: Tracy [name]  DOB: 1955-12-20  MRN: [id number]  Primary Cardiologist: Hillis Range, MD (Inactive)  Chart reviewed as part of pre-operative protocol coverage. Because of Davieon Dieujuste's past medical history and time since last visit, he will require a follow-up in-office visit in order to better assess preoperative cardiovascular risk.  Pre-op covering staff: - Please schedule appointment and call patient to inform them. If patient already had an upcoming appointment within acceptable timeframe, please add "pre-op clearance" to the appointment notes so provider is aware. - Please contact requesting surgeon's office via preferred method (i.e, phone, fax) to inform them of need for appointment prior to surgery.  Patient has a device.   Carlos Levering, NP  08/01/2023, 11:28 AM

## 2023-08-01 NOTE — Telephone Encounter (Signed)
Patient is not on any blood thinners or anti-platelets. From a cardio prospective, nothing needs to be held.

## 2023-08-02 NOTE — Telephone Encounter (Signed)
Patient scheduled to see Otilio Saber, PA on 09/08/23 for preop clearance. Per EP scheduler, patient has been placed on waitlist for sooner appointment

## 2023-08-03 ENCOUNTER — Other Ambulatory Visit: Payer: Self-pay

## 2023-08-03 MED ORDER — METOPROLOL SUCCINATE ER 50 MG PO TB24: ORAL_TABLET | ORAL | 0 refills | Status: DC

## 2023-08-03 MED ORDER — ATORVASTATIN CALCIUM 20 MG PO TABS: 20.0000 mg | ORAL_TABLET | Freq: Every day | ORAL | 0 refills | Status: DC

## 2023-08-04 ENCOUNTER — Inpatient Hospital Stay: Payer: Medicare Other | Admitting: Oncology

## 2023-08-04 ENCOUNTER — Inpatient Hospital Stay: Payer: Medicare Other | Attending: Oncology

## 2023-08-04 ENCOUNTER — Telehealth: Payer: Self-pay

## 2023-08-04 VITALS — BP 114/67 | HR 60 | Temp 98.1°F | Resp 18 | Ht 72.0 in | Wt 198.0 lb

## 2023-08-04 DIAGNOSIS — C182 Malignant neoplasm of ascending colon: Secondary | ICD-10-CM | POA: Diagnosis not present

## 2023-08-04 DIAGNOSIS — G4730 Sleep apnea, unspecified: Secondary | ICD-10-CM | POA: Diagnosis not present

## 2023-08-04 DIAGNOSIS — Z85038 Personal history of other malignant neoplasm of large intestine: Secondary | ICD-10-CM | POA: Insufficient documentation

## 2023-08-04 DIAGNOSIS — Z9221 Personal history of antineoplastic chemotherapy: Secondary | ICD-10-CM | POA: Diagnosis not present

## 2023-08-04 LAB — CEA (ACCESS): CEA (CHCC): 2.62 ng/mL (ref 0.00–5.00)

## 2023-08-04 NOTE — Telephone Encounter (Signed)
Patient gave verbal understanding and had no further questions or concerns

## 2023-08-04 NOTE — Telephone Encounter (Signed)
-----   Message from Thornton Papas sent at 08/04/2023  1:41 PM EDT ----- Please call patient, the CEA is normal, follow-up as scheduled

## 2023-08-04 NOTE — Progress Notes (Signed)
  [facility] OFFICE PROGRESS NOTE   Diagnosis: Colon cancer  INTERVAL HISTORY:   Mr. Chaffer returns as scheduled.  He feels well.  Good appetite and energy level.  He has left knee pain.  He is scheduled for left knee replacement surgery in a few weeks.  He continues colonoscopy surveillance with Dr. Dulce Sellar.  He is scheduled for a sigmoidoscopy next week.    Objective:  Vital signs in last 24 hours:  Blood pressure 114/67, pulse 60, temperature 98.1 F (36.7 C), temperature source Temporal, resp. rate 18, height 6' (1.829 m), weight 198 lb (89.8 kg), SpO2 98%.   Lymphatics: No cervical, supraclavicular, axillary, or inguinal nodes Resp: Clear bilaterally Cardio: Regular rate and rhythm GI: No hepatomegaly, no mass, nontender Vascular: No leg edema   Lab Results:  Lab Results  Component Value Date   WBC 8.4 08/14/2020   HGB 14.5 08/14/2020   HCT 43.7 08/14/2020   MCV 95.4 08/14/2020   PLT 193 08/14/2020   NEUTROABS 5.7 08/14/2020    CMP  Lab Results  Component Value Date   NA 140 06/30/2022   K 4.3 06/30/2022   CL 101 06/30/2022   CO2 26 06/30/2022   GLUCOSE 109 (H) 06/30/2022   BUN 12 06/30/2022   CREATININE 0.89 06/30/2022   CALCIUM 9.3 06/30/2022   PROT 6.9 06/30/2022   ALBUMIN 4.8 06/30/2022   AST 17 06/30/2022   ALT 14 06/30/2022   ALKPHOS 63 06/30/2022   BILITOT 0.8 06/30/2022   GFRNONAA >60 08/18/2021   GFRAA 105 09/08/2020    Lab Results  Component Value Date   CEA1 2.09 08/18/2021   CEA 2.19 02/03/2023     Medications: I have reviewed the patient's current medications.   Assessment/Plan: Adenocarcinoma the cecum, stage IIb (T4aN0), status post a right colectomy 09/01/2018 0/14 lymph nodes positive, perineural invasion present, no macroscopic tumor perforation, no lymphovascular invasion MSI-stable, no loss of mismatch repair protein expression Colonoscopy 08/08/2018- ulcerated partially obstructing mass at the "hepatic  flexure "-biopsy revealed invasive well-differentiated adenocarcinoma, no loss of mismatch repair protein expression, 6 mm polyp in the proximal transverse colon-not removed CTs 08/16/2018- cecal mass, no evidence of metastatic disease Cycle 1 CAPOX 10/09/2018 Cycle 2 CAPOX 10/30/2018 Cycle 3 CAPOX 11/20/2018 Cycle 4 CAPOX 12/11/2018 Surveillance CT scans 08/22/2019-no definitive evidence of metastatic disease.  Stable 4 mm lingular nodule. Surveillance colonoscopy November 2020 CTs 09/16/2020-no metastatic disease CTs 08/18/2021-no evidence of recurrent disease, stable 5 mm left upper lobe nodule Surveillance colonoscopy 12/08/2022-rectal polyp with superficially invasive moderately differentiated adenocarcinoma arising from a tubulovillous adenoma, no LVI, polyp completely removed, margin could not be accurately evaluated, no loss of mismatch repair protein expression Sigmoidoscopy 01/19/2023-no evidence of recurrent disease, rectal biopsy-negative for dysplasia   Arrhythmia-status post permanent pacemaker placement Microcytic anemia secondary to #1- resolved Sleep apnea Mild thrombocytopenia secondary to chemotherapy-resolved       Disposition: Mr. Kallay is in clinical remission from colorectal cancer.  He continues sigmoidoscopy surveillance with Dr. Dulce Sellar.  We will follow-up on the CEA from today.  Mr. Elting would like to continue follow-up at the Cancer center.  He will return for an office visit and CEA in 6 months.  Thornton Papas, MD  08/04/2023  8:20 AM

## 2023-08-08 ENCOUNTER — Ambulatory Visit: Payer: Medicare Other

## 2023-08-08 DIAGNOSIS — I495 Sick sinus syndrome: Secondary | ICD-10-CM

## 2023-08-09 LAB — CUP PACEART REMOTE DEVICE CHECK
Battery Impedance: 2300 Ohm
Battery Remaining Longevity: 38 mo
Battery Voltage: 2.74 V
Brady Statistic AP VP Percent: 22 %
Brady Statistic AP VS Percent: 0 %
Brady Statistic AS VP Percent: 0 %
Brady Statistic AS VS Percent: 77 %
Date Time Interrogation Session: 20240930193343
Implantable Lead Connection Status: 753985
Implantable Lead Connection Status: 753985
Implantable Lead Implant Date: 20040329
Implantable Lead Implant Date: 20040329
Implantable Lead Location: 753859
Implantable Lead Location: 753860
Implantable Lead Model: 4092
Implantable Lead Model: 4592
Implantable Pulse Generator Implant Date: 20120918
Lead Channel Impedance Value: 606 Ohm
Lead Channel Impedance Value: 719 Ohm
Lead Channel Pacing Threshold Amplitude: 1.375 V
Lead Channel Pacing Threshold Pulse Width: 0.4 ms
Lead Channel Setting Pacing Amplitude: 2 V
Lead Channel Setting Pacing Amplitude: 2.75 V
Lead Channel Setting Pacing Pulse Width: 0.4 ms
Lead Channel Setting Sensing Sensitivity: 2.8 mV
Zone Setting Status: 755011
Zone Setting Status: 755011

## 2023-08-10 DIAGNOSIS — M25562 Pain in left knee: Secondary | ICD-10-CM | POA: Diagnosis not present

## 2023-08-23 ENCOUNTER — Other Ambulatory Visit: Payer: Self-pay | Admitting: Physician Assistant

## 2023-08-23 NOTE — Progress Notes (Signed)
Remote pacemaker transmission.   

## 2023-09-07 NOTE — Progress Notes (Unsigned)
Electrophysiology Office Note:   ID:  Tracy, [name] Aug 03, 1956, MRN [id number]  Primary Cardiologist: Hillis Range, MD (Inactive) Electrophysiologist: Sherryl Manges, MD  {Click to update primary MD,subspecialty MD or APP then REFRESH:1}    History of Present Illness:   Tracy [name] is a 67 y.o. male with h/o symptomatic bradycardia s/p PPM, PVCs, OSA w CPAP, Dysautonomia, dilated ascending AO, h/o Colon Cancer s/p hemicolectomy, and HLD seen today for routine electrophysiology followup.   Since last being seen in our clinic the patient reports doing ***.  he denies chest pain, palpitations, dyspnea, PND, orthopnea, nausea, vomiting, dizziness, syncope, edema, weight gain, or early satiety.   Review of systems complete and found to be negative unless listed in HPI.   EP Information / Studies Reviewed:    EKG is ordered today. Personal review as below.       PPM Interrogation-  reviewed in detail today,  See PACEART report.  Device History: MDT dual chamber PPM implanted 2004, gen change 07/27/2011   Physical Exam:   VS:  There were no vitals taken for this visit.   Wt Readings from Last 3 Encounters:  08/04/23 198 lb (89.8 kg)  02/03/23 199 lb (90.3 kg)  09/01/22 199 lb 3.2 oz (90.4 kg)     GEN: Well nourished, well developed in no acute distress NECK: No JVD; No carotid bruits CARDIAC: {EPRHYTHM:28826}, no murmurs, rubs, gallops RESPIRATORY:  Clear to auscultation without rales, wheezing or rhonchi  ABDOMEN: Soft, non-tender, non-distended EXTREMITIES:  No edema; No deformity   ASSESSMENT AND PLAN:    Symptomatic bradycardia s/p Med[employer] PPM  Normal PPM function See Pace Art report No changes today Has not had syncope s/p pacing.  PVCs Asymptomatic Preserved LVEF prior echo  Dilated ascending Ao Semi-annual CTs to follow; ***  OSA  Encouraged nightly CPAP   Cardiac Clearance for Left TKA He has a device and is not on any blood thinners.  OK to proceed  from cardiac perspective.   {Click here to Review PMH, Prob List, Meds, Allergies, SHx, FHx  :1}   Disposition:   Follow up with {EPPROVIDERS:28135} {EPFOLLOW UP:28173}  Signed, Graciella Freer, PA-C

## 2023-09-08 ENCOUNTER — Ambulatory Visit: Payer: Medicare Other | Attending: Student | Admitting: Student

## 2023-09-08 ENCOUNTER — Encounter: Payer: Self-pay | Admitting: Student

## 2023-09-08 VITALS — BP 118/72 | HR 58 | Ht 72.0 in | Wt 202.4 lb

## 2023-09-08 DIAGNOSIS — Z01810 Encounter for preprocedural cardiovascular examination: Secondary | ICD-10-CM

## 2023-09-08 DIAGNOSIS — I493 Ventricular premature depolarization: Secondary | ICD-10-CM

## 2023-09-08 DIAGNOSIS — G4733 Obstructive sleep apnea (adult) (pediatric): Secondary | ICD-10-CM

## 2023-09-08 DIAGNOSIS — Z950 Presence of cardiac pacemaker: Secondary | ICD-10-CM

## 2023-09-08 DIAGNOSIS — I495 Sick sinus syndrome: Secondary | ICD-10-CM | POA: Diagnosis not present

## 2023-09-08 LAB — CUP PACEART INCLINIC DEVICE CHECK
Battery Impedance: 2241 Ohm
Battery Remaining Longevity: 37 mo
Battery Voltage: 2.75 V
Brady Statistic AP VP Percent: 23 %
Brady Statistic AP VS Percent: 0 %
Brady Statistic AS VP Percent: 0 %
Brady Statistic AS VS Percent: 77 %
Date Time Interrogation Session: 20241031122504
Implantable Lead Connection Status: 753985
Implantable Lead Connection Status: 753985
Implantable Lead Implant Date: 20040329
Implantable Lead Implant Date: 20040329
Implantable Lead Location: 753859
Implantable Lead Location: 753860
Implantable Lead Model: 4092
Implantable Lead Model: 4592
Implantable Pulse Generator Implant Date: 20120918
Lead Channel Impedance Value: 626 Ohm
Lead Channel Impedance Value: 700 Ohm
Lead Channel Pacing Threshold Amplitude: 0.75 V
Lead Channel Pacing Threshold Amplitude: 1.25 V
Lead Channel Pacing Threshold Amplitude: 1.25 V
Lead Channel Pacing Threshold Pulse Width: 0.4 ms
Lead Channel Pacing Threshold Pulse Width: 0.4 ms
Lead Channel Pacing Threshold Pulse Width: 0.4 ms
Lead Channel Sensing Intrinsic Amplitude: 4 mV
Lead Channel Sensing Intrinsic Amplitude: 8 mV
Lead Channel Setting Pacing Amplitude: 2 V
Lead Channel Setting Pacing Amplitude: 2.5 V
Lead Channel Setting Pacing Pulse Width: 0.4 ms
Lead Channel Setting Sensing Sensitivity: 2.8 mV
Zone Setting Status: 755011
Zone Setting Status: 755011

## 2023-09-08 NOTE — Patient Instructions (Signed)
Medication Instructions:  Your physician recommends that you continue on your current medications as directed. Please refer to the Current Medication list given to you today.  *If you need a refill on your cardiac medications before your next appointment, please call your pharmacy*   Lab Work: None If you have labs (blood work) drawn today and your tests are completely normal, you will receive your results only by: [facility] (if you have MyChart) OR A paper copy in the mail If you have any lab test that is abnormal or we need to change your treatment, we will call you to review the results.   Follow-Up: At [facility], you and your health needs are our priority.  As part of our continuing mission to provide you with exceptional heart care, we have created designated Provider Care Teams.  These Care Teams include your primary Cardiologist (physician) and Advanced Practice Providers (APPs -  Physician Assistants and Nurse Practitioners) who all work together to provide you with the care you need, when you need it.   Your next appointment:   1 year(s)  Provider:   Virl Axe, MD

## 2023-09-08 NOTE — Progress Notes (Signed)
Note forwarded for preop clearance.

## 2023-09-12 ENCOUNTER — Other Ambulatory Visit: Payer: Self-pay | Admitting: Orthopedic Surgery

## 2023-09-16 NOTE — Progress Notes (Addendum)
COVID Vaccine received:  []  No [x]  Yes Date of any COVID positive Test in last 90 days: no PCP - Joycelyn Rua MD Cardiologist - Hillis Range MD Electrophys.Sherryl Manges MD  Cardiac clearance Maxine Glenn PA-C-09/08/23  Chest x-ray -  EKG -  09/08/23 Epic Stress Test -  ECHO - 09/16/20 Epic Cardiac Cath -   Bowel Prep - []  No  []   Yes ______  Pacemaker / ICD device []  No [x]  Yes   Spinal Cord Stimulator:[x]  No []  Yes       History of Sleep Apnea? []  No [x]  Yes   CPAP used?- [x]  No []  Yes    Does the patient monitor blood sugar?          [x]  No []  Yes  []  N/A  Patient has: [x]  NO Hx DM   []  Pre-DM                 []  DM1  []   DM2 Does patient have a [employer] or Dexacom? []  No []  Yes   Fasting Blood Sugar Ranges-  Checks Blood Sugar _____ times a day  GLP1 agonist / usual dose - no GLP1 instructions:  SGLT-2 inhibitors / usual dose - no SGLT-2 instructions: no  Blood Thinner / Instructions:no Aspirin Instructions:no  Comments:   Activity level: Patient is able to climb a flight of stairs without difficulty; [x]  No CP  [x]  No SOB,___   Patient can perform ADLs without assistance.   Anesthesia review: pacemaker  Patient denies shortness of breath, fever, cough and chest pain at PAT appointment.  Patient verbalized understanding and agreement to the Pre-Surgical Instructions that were given to them at this PAT appointment. Patient was also educated of the need to review these PAT instructions again prior to his/her surgery.I reviewed the appropriate phone numbers to call if they have any and questions or concerns.

## 2023-09-16 NOTE — Patient Instructions (Signed)
SURGICAL WAITING ROOM VISITATION  Patients having surgery or a procedure may have no more than 2 support people in the waiting area - these visitors may rotate.    Children under the age of 6 must have an adult with them who is not the patient.  Due to an increase in RSV and influenza rates and associated hospitalizations, children ages 51 and under may not visit patients in [facility] hospitals.  If the patient needs to stay at the hospital during part of their recovery, the visitor guidelines for inpatient rooms apply. Pre-op nurse will coordinate an appropriate time for 1 support person to accompany patient in pre-op.  This support person may not rotate.    Please refer to the [facility] website for the visitor guidelines for Inpatients (after your surgery is over and you are in a regular room).       Your procedure is scheduled on: 09/26/23   Report to [facility] Main Entrance    Report to admitting at 7:30 AM   Call this number if you have problems the morning of surgery [phone]   Do not eat food :After Midnight.   After Midnight you may have the following liquids until 7AM DAY OF SURGERY  Water Non-Citrus Juices (without pulp, NO RED-Apple, White grape, White cranberry) Black Coffee (NO MILK/CREAM OR CREAMERS, sugar ok)  Clear Tea (NO MILK/CREAM OR CREAMERS, sugar ok) regular and decaf                             Plain Jell-O (NO RED)                                           Fruit ices (not with fruit pulp, NO RED)                                     Popsicles (NO RED)                                                               Sports drinks like Gatorade (NO RED)                  The day of surgery:  Drink ONE (1) Pre-Surgery Clear Ensure at 7 AM the morning of surgery. Drink in one sitting. Do not sip.  This drink was given to you during your hospital  pre-op appointment visit. Nothing else to drink after completing the  Pre-Surgery Clear Ensure         Oral Hygiene is also important to reduce your risk of infection.                                    Remember - BRUSH YOUR TEETH THE MORNING OF SURGERY WITH YOUR REGULAR TOOTHPASTE  DENTURES WILL BE REMOVED PRIOR TO SURGERY PLEASE DO NOT APPLY "Poly grip" OR ADHESIVES!!!   Stop all vitamins and herbal supplements 7 days before surgery.   Take these  medicines the morning of surgery with A SIP OF WATER: Prevagen, Atorvastatin, Metoprolol, Paxil  Bring CPAP mask and tubing day of surgery.                              You may not have any metal on your body including hair pins, jewelry, and body piercing             Do not wear make-up, lotions, powders, perfumes/cologne, or deodorant                Men may shave face and neck.   Do not bring valuables to the hospital. [place] IS NOT             RESPONSIBLE   FOR VALUABLES.   Contacts, glasses, dentures or bridgework may not be worn into surgery.  DO NOT BRING YOUR HOME MEDICATIONS TO THE HOSPITAL. PHARMACY WILL DISPENSE MEDICATIONS LISTED ON YOUR MEDICATION LIST TO YOU DURING YOUR ADMISSION IN THE HOSPITAL!    Patients discharged on the day of surgery will not be allowed to drive home.  Someone NEEDS to stay with you for the first 24 hours after anesthesia.   Special Instructions: Bring a copy of your healthcare power of attorney and living will documents the day of surgery if you haven't scanned them before.              Please read over the following fact sheets you were given: IF YOU HAVE QUESTIONS ABOUT YOUR PRE-OP INSTRUCTIONS PLEASE CALL [phone] Rosey Bath   If you received a COVID test during your pre-op visit  it is requested that you wear a mask when out in public, stay away from anyone that may not be feeling well and notify your surgeon if you develop symptoms. If you test positive for Covid or have been in contact with anyone that has tested positive in the last 10 days please notify you surgeon.       Pre-operative 5 CHG Bath Instructions   You can play a key role in reducing the risk of infection after surgery. Your skin needs to be as free of germs as possible. You can reduce the number of germs on your skin by washing with CHG (chlorhexidine gluconate) soap before surgery. CHG is an antiseptic soap that kills germs and continues to kill germs even after washing.   DO NOT use if you have an allergy to chlorhexidine/CHG or antibacterial soaps. If your skin becomes reddened or irritated, stop using the CHG and notify one of our RNs at [phone].   Please shower with the CHG soap starting 4 days before surgery using the following schedule:     Please keep in mind the following:  DO NOT shave, including legs and underarms, starting the day of your first shower.   You may shave your face at any point before/day of surgery.  Place clean sheets on your bed the day you start using CHG soap. Use a clean washcloth (not used since being washed) for each shower. DO NOT sleep with pets once you start using the CHG.   CHG Shower Instructions:  If you choose to wash your hair and private area, wash first with your normal shampoo/soap.  After you use shampoo/soap, rinse your hair and body thoroughly to remove shampoo/soap residue.  Turn the water OFF and apply about 3 tablespoons (45 ml) of CHG soap to a CLEAN washcloth.  Apply CHG  soap ONLY FROM YOUR NECK DOWN TO YOUR TOES (washing for 3-5 minutes)  DO NOT use CHG soap on face, private areas, open wounds, or sores.  Pay special attention to the area where your surgery is being performed.  If you are having back surgery, having someone wash your back for you may be helpful. Wait 2 minutes after CHG soap is applied, then you may rinse off the CHG soap.  Pat dry with a clean towel  Put on clean clothes/pajamas   If you choose to wear lotion, please use ONLY the CHG-compatible lotions on the back of this paper.     Additional instructions for  the day of surgery: DO NOT APPLY any lotions, deodorants, cologne, or perfumes.   Put on clean/comfortable clothes.  Brush your teeth.  Ask your nurse before applying any prescription medications to the skin.      CHG Compatible Lotions   Aveeno Moisturizing lotion  Cetaphil Moisturizing Cream  Cetaphil Moisturizing Lotion  Clairol Herbal Essence Moisturizing Lotion, Dry Skin  Clairol Herbal Essence Moisturizing Lotion, Extra Dry Skin  Clairol Herbal Essence Moisturizing Lotion, Normal Skin  Curel Age Defying Therapeutic Moisturizing Lotion with Alpha Hydroxy  Curel Extreme Care Body Lotion  Curel Soothing Hands Moisturizing Hand Lotion  Curel Therapeutic Moisturizing Cream, Fragrance-Free  Curel Therapeutic Moisturizing Lotion, Fragrance-Free  Curel Therapeutic Moisturizing Lotion, Original Formula  Eucerin Daily Replenishing Lotion  Eucerin Dry Skin Therapy Plus Alpha Hydroxy Crme  Eucerin Dry Skin Therapy Plus Alpha Hydroxy Lotion  Eucerin Original Crme  Eucerin Original Lotion  Eucerin Plus Crme Eucerin Plus Lotion  Eucerin TriLipid Replenishing Lotion  Keri Anti-Bacterial Hand Lotion  Keri Deep Conditioning Original Lotion Dry Skin Formula Softly Scented  Keri Deep Conditioning Original Lotion, Fragrance Free Sensitive Skin Formula  Keri Lotion Fast Absorbing Fragrance Free Sensitive Skin Formula  Keri Lotion Fast Absorbing Softly Scented Dry Skin Formula  Keri Original Lotion  Keri Skin Renewal Lotion Keri Silky Smooth Lotion  Keri Silky Smooth Sensitive Skin Lotion  Nivea Body Creamy Conditioning Oil  Nivea Body Extra Enriched Lotion  Nivea Body Original Lotion  Nivea Body Sheer Moisturizing Lotion Nivea Crme  Nivea Skin Firming Lotion  NutraDerm 30 Skin Lotion  NutraDerm Skin Lotion  NutraDerm Therapeutic Skin Cream  NutraDerm Therapeutic Skin Lotion  ProShield Protective Hand Cream   Incentive Spirometer (Watch this video at home:  [email or web address])  An incentive spirometer is a tool that can help keep your lungs clear and active. This tool measures how well you are filling your lungs with each breath. Taking long deep breaths may help reverse or decrease the chance of developing breathing (pulmonary) problems (especially infection) following: A long period of time when you are unable to move or be active. BEFORE THE PROCEDURE  If the spirometer includes an indicator to show your best effort, your nurse or respiratory therapist will set it to a desired goal. If possible, sit up straight or lean slightly forward. Try not to slouch. Hold the incentive spirometer in an upright position. INSTRUCTIONS FOR USE  Sit on the edge of your bed if possible, or sit up as far as you can in bed or on a chair. Hold the incentive spirometer in an upright position. Breathe out normally. Place the mouthpiece in your mouth and seal your lips tightly around it. Breathe in slowly and as deeply as possible, raising the piston or the ball toward the top of the column. Hold your breath for 3-5 seconds or for  as long as possible. Allow the piston or ball to fall to the bottom of the column. Remove the mouthpiece from your mouth and breathe out normally. Rest for a few seconds and repeat Steps 1 through 7 at least 10 times every 1-2 hours when you are awake. Take your time and take a few normal breaths between deep breaths. The spirometer may include an indicator to show your best effort. Use the indicator as a goal to work toward during each repetition. After each set of 10 deep breaths, practice coughing to be sure your lungs are clear. If you have an incision (the cut made at the time of surgery), support your incision when coughing by placing a pillow or rolled up towels firmly against it. Once you are able to get out of bed, walk around indoors and cough well. You may stop using the incentive spirometer when instructed  by your caregiver.  RISKS AND COMPLICATIONS Take your time so you do not get dizzy or light-headed. If you are in pain, you may need to take or ask for pain medication before doing incentive spirometry. It is harder to take a deep breath if you are having pain. AFTER USE Rest and breathe slowly and easily. It can be helpful to keep track of a log of your progress. Your caregiver can provide you with a simple table to help with this. If you are using the spirometer at home, follow these instructions: SEEK MEDICAL CARE IF:  You are having difficultly using the spirometer. You have trouble using the spirometer as often as instructed. Your pain medication is not giving enough relief while using the spirometer. You develop fever of 100.5 F (38.1 C) or higher. SEEK IMMEDIATE MEDICAL CARE IF:  You cough up bloody sputum that had not been present before. You develop fever of 102 F (38.9 C) or greater. You develop worsening pain at or near the incision site. MAKE SURE YOU:  Understand these instructions. Will watch your condition. Will get help right away if you are not doing well or get worse. Document Released: 03/07/2007 Document Revised: 01/17/2012 Document Reviewed: 05/08/2007 [facility] Patient Information 2014 [place], [place].

## 2023-09-18 ENCOUNTER — Other Ambulatory Visit: Payer: Self-pay | Admitting: Physician Assistant

## 2023-09-19 ENCOUNTER — Other Ambulatory Visit: Payer: Self-pay

## 2023-09-19 ENCOUNTER — Encounter: Payer: Self-pay | Admitting: Internal Medicine

## 2023-09-19 ENCOUNTER — Ambulatory Visit (HOSPITAL_COMMUNITY)
Admission: RE | Admit: 2023-09-19 | Discharge: 2023-09-19 | Disposition: A | Discharge status: Home / Self Care | Payer: Medicare Other | Source: Ambulatory Visit | Attending: Orthopedic Surgery | Admitting: Orthopedic Surgery

## 2023-09-19 ENCOUNTER — Encounter (HOSPITAL_COMMUNITY)
Admission: RE | Admit: 2023-09-19 | Discharge: 2023-09-19 | Disposition: A | Discharge status: Home / Self Care | Payer: Medicare Other | Source: Ambulatory Visit | Attending: Orthopedic Surgery | Admitting: Orthopedic Surgery

## 2023-09-19 ENCOUNTER — Encounter (HOSPITAL_COMMUNITY): Payer: Self-pay

## 2023-09-19 VITALS — BP 125/92 | HR 103 | Temp 98.5°F | Resp 16 | Ht 72.0 in | Wt 202.0 lb

## 2023-09-19 DIAGNOSIS — R262 Difficulty in walking, not elsewhere classified: Secondary | ICD-10-CM | POA: Diagnosis not present

## 2023-09-19 DIAGNOSIS — M1732 Unilateral post-traumatic osteoarthritis, left knee: Secondary | ICD-10-CM | POA: Diagnosis not present

## 2023-09-19 DIAGNOSIS — Z01818 Encounter for other preprocedural examination: Secondary | ICD-10-CM | POA: Diagnosis not present

## 2023-09-19 DIAGNOSIS — M25662 Stiffness of left knee, not elsewhere classified: Secondary | ICD-10-CM | POA: Diagnosis not present

## 2023-09-19 HISTORY — DX: Unspecified osteoarthritis, unspecified site: M19.90

## 2023-09-19 LAB — CBC
HCT: 42.3 % (ref 39.0–52.0)
Hemoglobin: 14.2 g/dL (ref 13.0–17.0)
MCH: 33.4 pg (ref 26.0–34.0)
MCHC: 33.6 g/dL (ref 30.0–36.0)
MCV: 99.5 fL (ref 80.0–100.0)
Platelets: 192 10*3/uL (ref 150–400)
RBC: 4.25 MIL/uL (ref 4.22–5.81)
RDW: 12.7 % (ref 11.5–15.5)
WBC: 8.5 10*3/uL (ref 4.0–10.5)
nRBC: 0 % (ref 0.0–0.2)

## 2023-09-19 LAB — SURGICAL PCR SCREEN
MRSA, PCR: NEGATIVE
Staphylococcus aureus: POSITIVE — AB

## 2023-09-19 NOTE — Progress Notes (Signed)
Please review Preop PCR result from 09/19/23.

## 2023-09-19 NOTE — Care Plan (Signed)
Ortho Bundle Case Management Note  Patient Details  Name: Tracy [name] MRN: [id number] Date of Birth: Nov 14, 1955 Spoke with patient prior to surgery, he will discharge to home with family.  Rolling walker ordered. OPPT set up with [facility]. Discharge instructions discussed and questions answered. Patient and MD in agreement with plan                      DME Arranged:  Walker rolling DME Agency:  [employer]  HH Arranged:    HH Agency:     Additional Comments: Please contact me with any questions of if this plan should need to change.  Shauna Hugh,  RN,BSN,MHA,CCM  [facility]opaedic Specialist  [phone] 09/19/2023, 4:30 PM

## 2023-09-19 NOTE — Progress Notes (Signed)
PERIOPERATIVE PRESCRIPTION FOR IMPLANTED CARDIAC DEVICE PROGRAMMING  Patient Information: Name:  Brelyn Gorrie  DOB:  Jul 14, 1956  MRN:  [id number]  Planned Procedure:  TOTAL KNEE ARTHROPLASTY  Surgeon:  Jodi Geralds  Date of Procedure:  09/26/2023  Cautery will be used.  Position during surgery:  supine   Device Information:  Clinic EP Physician:  Sherryl Manges, MD   Device Type:  Pacemaker Manufacturer and Phone #:  Medtronic: [phone] Pacemaker Dependent?:  No. Date of Last Device Check:  09/08/2023 Normal Device Function?:  Yes.    Electrophysiologist's Recommendations:  Have magnet available. Provide continuous ECG monitoring when magnet is used or reprogramming is to be performed.  Procedure should not interfere with device function.  No device programming or magnet placement needed.  Per Device Clinic Standing Orders, Wiliam Ke, RN  3:47 PM 09/19/2023

## 2023-09-22 DIAGNOSIS — M1712 Unilateral primary osteoarthritis, left knee: Secondary | ICD-10-CM | POA: Diagnosis not present

## 2023-09-25 DIAGNOSIS — M1712 Unilateral primary osteoarthritis, left knee: Secondary | ICD-10-CM | POA: Diagnosis present

## 2023-09-25 NOTE — H&P (Signed)
TOTAL KNEE ADMISSION H&P  Patient is being admitted for left total knee arthroplasty.  Subjective:  Chief Complaint:left knee pain.  HPI: Tracy [name], 67 y.o. male, has a history of pain and functional disability in the left knee due to arthritis and has failed non-surgical conservative treatments for greater than 12 weeks to includeNSAID's and/or analgesics, corticosteriod injections, flexibility and strengthening excercises, weight reduction as appropriate, and activity modification.  Onset of symptoms was gradual, starting 5 years ago with gradually worsening course since that time. The patient noted no past surgery on the left knee(s).  Patient currently rates pain in the left knee(s) at 8 out of 10 with activity. Patient has night pain, worsening of pain with activity and weight bearing, pain that interferes with activities of daily living, pain with passive range of motion, crepitus, and joint swelling.  Patient has evidence of subchondral cysts, subchondral sclerosis, periarticular osteophytes, and joint space narrowing by imaging studies.  There is no active infection.  Patient Active Problem List   Diagnosis Date Noted   Primary osteoarthritis of left knee 09/25/2023   Colon cancer (HCC) 09/01/2018   OSA (obstructive sleep apnea) 07/21/2011   Syncope 03/04/2011   Bradycardia 03/04/2011   Past Medical History:  Diagnosis Date   Arthritis    Bradycardia 01/01/2003   s/p PPM (MDT)   Cancer (HCC)    colon cancer   Dyslipidemia    Myopia    OSA (obstructive sleep apnea) 07/21/2011   NPSG 2012:  AHI 88/hr with both obstructive and central events.  Auto 10/2013:  Optimal pressure 16cm.     Presence of permanent cardiac pacemaker    due to node dysfunction   Sleep apnea 07/2011   severe   Syncope 2008   dysautonomia    Past Surgical History:  Procedure Laterality Date   CARDIAC CATHETERIZATION  2008   no CAD   HERNIA REPAIR Left    inguinal hernia   LAPAROSCOPIC PARTIAL  COLECTOMY N/A 09/01/2018   Procedure: LAPAROSCOPIC ASSISTED PARTIAL COLECTOMY;  Surgeon: Abigail Miyamoto, MD;  Location: [facility] OR;  Service: General;  Laterality: N/A;   MENISCUS REPAIR Left    PACEMAKER PLACEMENT  01/01/03   generator change (MDT) by Dr Johney Frame 2012   PARTIAL COLECTOMY  09/01/2018    No current facility-administered medications for this encounter.   Current Outpatient Medications  Medication Sig Dispense Refill Last Dose   Apoaequorin (PREVAGEN) 10 MG CAPS Take 10 mg by mouth daily.      ferrous sulfate 325 (65 FE) MG tablet Take 325 mg by mouth daily.      ibuprofen (ADVIL,MOTRIN) 200 MG tablet Take 400 mg by mouth every 8 (eight) hours as needed (for pain.).      loperamide (IMODIUM) 2 MG capsule Take 6 mg by mouth daily.      PARoxetine (PAXIL) 20 MG tablet TAKE 1 TABLET(20 MG) BY MOUTH DAILY 30 tablet 1    atorvastatin (LIPITOR) 20 MG tablet TAKE 1 TABLET BY MOUTH DAILY 90 tablet 3    metoprolol succinate (TOPROL-XL) 50 MG 24 hr tablet TAKE 1 TABLET BY MOUTH DAILY 90 tablet 3    No Known Allergies  Social History   Tobacco Use   Smoking status: Former    Types: Cigars    Quit date: 08/01/2018    Years since quitting: 5.1   Smokeless tobacco: Never   Tobacco comments:    3 cigars a week  Substance Use Topics   Alcohol use: Not  Currently    Alcohol/week: 0.0 standard drinks of alcohol    Comment: beer-- 1-2 daily Stopped drinking 02/2018    Family History  Problem Relation Age of Onset   Heart disease Other      Review of Systems Positive for joint pains. Objective:  Physical Exam Well-developed well-nourished patient in no acute distress. Alert and oriented x3 HEENT:within normal limits Cardiac: Regular rate and rhythm Pulmonary: Lungs clear to auscultation Abdomen: Soft and nontender.  Normal active bowel sounds  Musculoskeletal: Left knee exam: Range of motion -5 to 110 of flexion.  No instability.  Crepitation through range of motion.  No  ligamentous laxity.pain through range of motion.  Vital signs in last 24 hours:    Labs: Recent Results (from the past 2160 hour(s))  CEA (Access)     Status: None   Collection Time: 08/04/23  8:05 AM  Result Value Ref Range   CEA ([facility]) 2.62 0.00 - 5.00 ng/mL    Comment: (NOTE) This test was performed using Beckman Coulter's paramagnetic chemiluminescent immunoassay. Values obtained from different assay methods cannot be used interchangeably. Please note that up to 8% of patients who smoke may see values 5.1-10.0 ng/ml and 1% of patients who smoke may see CEA levels >10.0 ng/ml. Performed at [facility], [address] [place], [place] [address]   CUP PACEART REMOTE DEVICE CHECK     Status: None   Collection Time: 08/08/23  7:33 PM  Result Value Ref Range   Date Time Interrogation Session [id number]    Pulse Generator Manufacturer MERM    Pulse Gen Model ADDRL1 Adapta    Pulse Gen Serial Number [id number]    Clinic Name [facility]    Implantable Pulse Generator Type Implantable Pulse Generator    Implantable Pulse Generator Implant Date [id number]    Implantable Lead Manufacturer MERM    Implantable Lead Model 4092 CapSure SP Novus    Implantable Lead Serial Number [id number]    Implantable Lead Implant Date 62952841    Implantable Lead Location Detail 1 APEX    Implantable Lead Location [id number]    Implantable Lead Connection Status L088196    Implantable Lead Manufacturer MERM    Implantable Lead Model 4592 CapSure SP Novus    Implantable Lead Serial Number [id number]    Implantable Lead Implant Date 32440102    Implantable Lead Location Detail 1 APPENDAGE    Implantable Lead Location [id number]    Implantable Lead Connection Status L088196    Lead Channel Setting Sensing Sensitivity 2.80 mV   Lead Channel Setting Pacing Amplitude 2.000 V   Lead Channel Setting Pacing Pulse Width 0.40 ms   Lead Channel Setting Pacing Amplitude 2.750 V   Zone  Setting Status 755011    Zone Setting Status 755011    Lead Channel Impedance Value 606 ohm   Lead Channel Impedance Value 719 ohm   Lead Channel Pacing Threshold Amplitude 1.375 V   Lead Channel Pacing Threshold Pulse Width 0.40 ms   Battery Status OK    Battery Remaining Longevity 38 mo   Battery Voltage 2.74 V   Battery Impedance 2,300 ohm   Brady Statistic AP VP Percent 22 %   Brady Statistic AS VP Percent 0 %   Brady Statistic AP VS Percent 0 %   Brady Statistic AS VS Percent 77 %  CUP PACEART INCLINIC DEVICE CHECK     Status: None   Collection Time: 09/08/23 12:25 PM  Result Value Ref Range  Date Time Interrogation Session [phone]    Pulse Generator Manufacturer MERM    Pulse Gen Model ADDRL1 Adapta    Pulse Gen Serial Number [id number]    Clinic Name [facility]    Implantable Pulse Generator Type Implantable Pulse Generator    Implantable Pulse Generator Implant Date [id number]    Implantable Lead Manufacturer MERM    Implantable Lead Model 4092 CapSure SP Novus    Implantable Lead Serial Number [id number]    Implantable Lead Implant Date 84132440    Implantable Lead Location Detail 1 APEX    Implantable Lead Location [id number]    Implantable Lead Connection Status L088196    Implantable Lead Manufacturer MERM    Implantable Lead Model 4592 CapSure SP Novus    Implantable Lead Serial Number [id number]    Implantable Lead Implant Date 10272536    Implantable Lead Location Detail 1 APPENDAGE    Implantable Lead Location [id number]    Implantable Lead Connection Status [id number]    Lead Channel Setting Sensing Sensitivity 2.80 mV   Lead Channel Setting Pacing Amplitude 2.000 V   Lead Channel Setting Pacing Pulse Width 0.40 ms   Lead Channel Setting Pacing Amplitude 2.500 V   Zone Setting Status 755011    Zone Setting Status 755011    Lead Channel Impedance Value 626 ohm   Lead Channel Sensing Intrinsic Amplitude 4.00 mV   Lead Channel Pacing Threshold Amplitude  0.750 V   Lead Channel Pacing Threshold Pulse Width 0.40 ms   Lead Channel Impedance Value 700 ohm   Lead Channel Sensing Intrinsic Amplitude 8.00 mV   Lead Channel Pacing Threshold Amplitude 1.250 V   Lead Channel Pacing Threshold Pulse Width 0.40 ms   Lead Channel Pacing Threshold Amplitude 1.250 V   Lead Channel Pacing Threshold Pulse Width 0.40 ms   Battery Status OK    Battery Remaining Longevity 37 mo   Battery Voltage 2.75 V   Battery Impedance 2,241 ohm   Brady Statistic AP VP Percent 23 %   Brady Statistic AS VP Percent 0 %   Brady Statistic AP VS Percent 0 %   Brady Statistic AS VS Percent 77 %  CBC per protocol     Status: None   Collection Time: 09/19/23  9:31 AM  Result Value Ref Range   WBC 8.5 4.0 - 10.5 K/uL   RBC 4.25 4.22 - 5.81 MIL/uL   Hemoglobin 14.2 13.0 - 17.0 g/dL   HCT 74.2 59.5 - 63.8 %   MCV 99.5 80.0 - 100.0 fL   MCH 33.4 26.0 - 34.0 pg   MCHC 33.6 30.0 - 36.0 g/dL   RDW 75.6 43.3 - 29.5 %   Platelets 192 150 - 400 K/uL   nRBC 0.0 0.0 - 0.2 %    Comment: Performed at [facility], [address] [place], [place] [address]  Surgical pcr screen     Status: Abnormal   Collection Time: 09/19/23 10:04 AM   Specimen: Nasal Mucosa; Nasal Swab  Result Value Ref Range   MRSA, PCR NEGATIVE NEGATIVE   Staphylococcus aureus POSITIVE (A) NEGATIVE    Comment: (NOTE) The Xpert SA Assay (FDA approved for NASAL specimens in patients 1 years of age and older), is one component of a comprehensive surveillance program. It is not intended to diagnose infection nor to guide or monitor treatment. Performed at [facility], [address] [place], [place] [address]      Estimated body mass index  is 27.4 kg/m as calculated from the following:   Height as of 09/19/23: 6' (1.829 m).   Weight as of 09/19/23: 91.6 kg.   Imaging Review Plain radiographs demonstrate severe degenerative joint disease of the left knee(s). The  overall alignment isneutral. The bone quality appears to be good for age and reported activity level.      Assessment/Plan:  End stage arthritis, left knee   The patient history, physical examination, clinical judgment of the provider and imaging studies are consistent with end stage degenerative joint disease of the left knee(s) and total knee arthroplasty is deemed medically necessary. The treatment options including medical management, injection therapy arthroscopy and arthroplasty were discussed at length. The risks and benefits of total knee arthroplasty were presented and reviewed. The risks due to aseptic loosening, infection, stiffness, patella tracking problems, thromboembolic complications and other imponderables were discussed. The patient acknowledged the explanation, agreed to proceed with the plan and consent was signed. Patient is being admitted for inpatient treatment for surgery, pain control, PT, OT, prophylactic antibiotics, VTE prophylaxis, progressive ambulation and ADL's and discharge planning. The patient is planning to be discharged  to outpatient physical therapy.     Patient's anticipated LOS is less than 2 midnights, meeting these requirements: - Younger than 69 - Lives within 1 hour of care - Has a competent adult at home to recover with post-op recover - NO history of  - Chronic pain requiring opiods  - Diabetes  - Coronary Artery Disease  - Heart failure  - Heart attack  - Stroke  - DVT/VTE  - Cardiac arrhythmia  - Respiratory Failure/COPD  - Renal failure  - Anemia  - Advanced Liver disease

## 2023-09-25 NOTE — Anesthesia Preprocedure Evaluation (Signed)
Anesthesia Evaluation  Patient identified by MRN, date of birth, ID band Patient awake    Reviewed: Allergy & Precautions, NPO status , Patient's Chart, lab work & pertinent test results, reviewed documented beta blocker date and time   History of Anesthesia Complications Negative for: history of anesthetic complications  Airway Mallampati: III  TM Distance: >3 FB Neck ROM: Full    Dental no notable dental hx.    Pulmonary sleep apnea , former smoker   Pulmonary exam normal        Cardiovascular Pt. on home beta blockers Normal cardiovascular exam+ pacemaker      Neuro/Psych negative neurological ROS     GI/Hepatic negative GI ROS, Neg liver ROS,,,  Endo/Other  negative endocrine ROS    Renal/GU negative Renal ROS  negative genitourinary   Musculoskeletal  (+) Arthritis ,    Abdominal   Peds  Hematology negative hematology ROS (+)   Anesthesia Other Findings Day of surgery medications reviewed with patient.  Reproductive/Obstetrics negative OB ROS                              Anesthesia Physical Anesthesia Plan  ASA: 2  Anesthesia Plan: Spinal   Post-op Pain Management:  Regional for Post-op pain and Regional block* and Tylenol PO (pre-op)*   Induction:   PONV Risk Score and Plan: 2 and Treatment may vary due to age or medical condition, Ondansetron, Propofol infusion, Dexamethasone and Midazolam  Airway Management Planned: Natural Airway and Simple Face Mask  Additional Equipment: None  Intra-op Plan:   Post-operative Plan:   Informed Consent: I have reviewed the patients History and Physical, chart, labs and discussed the procedure including the risks, benefits and alternatives for the proposed anesthesia with the patient or authorized representative who has indicated his/her understanding and acceptance.       Plan Discussed with: CRNA  Anesthesia Plan Comments:           Anesthesia Quick Evaluation

## 2023-09-26 ENCOUNTER — Encounter (HOSPITAL_COMMUNITY)
Admission: RE | Disposition: A | Discharge status: Home / Self Care | Payer: Self-pay | Source: Home / Self Care | Attending: Orthopedic Surgery

## 2023-09-26 ENCOUNTER — Other Ambulatory Visit: Payer: Self-pay

## 2023-09-26 ENCOUNTER — Encounter (HOSPITAL_COMMUNITY): Payer: Self-pay | Admitting: Orthopedic Surgery

## 2023-09-26 ENCOUNTER — Ambulatory Visit (HOSPITAL_COMMUNITY): Payer: Medicare Other | Admitting: Anesthesiology

## 2023-09-26 ENCOUNTER — Ambulatory Visit (HOSPITAL_COMMUNITY)
Admission: RE | Admit: 2023-09-26 | Discharge: 2023-09-26 | Disposition: A | Discharge status: Home / Self Care | Payer: Medicare Other | Attending: Orthopedic Surgery | Admitting: Orthopedic Surgery

## 2023-09-26 DIAGNOSIS — Z96652 Presence of left artificial knee joint: Secondary | ICD-10-CM | POA: Diagnosis not present

## 2023-09-26 DIAGNOSIS — M1712 Unilateral primary osteoarthritis, left knee: Secondary | ICD-10-CM | POA: Diagnosis not present

## 2023-09-26 DIAGNOSIS — Z87891 Personal history of nicotine dependence: Secondary | ICD-10-CM | POA: Insufficient documentation

## 2023-09-26 DIAGNOSIS — G4730 Sleep apnea, unspecified: Secondary | ICD-10-CM | POA: Diagnosis not present

## 2023-09-26 DIAGNOSIS — G8918 Other acute postprocedural pain: Secondary | ICD-10-CM | POA: Diagnosis not present

## 2023-09-26 DIAGNOSIS — Z950 Presence of cardiac pacemaker: Secondary | ICD-10-CM | POA: Insufficient documentation

## 2023-09-26 DIAGNOSIS — Z79899 Other long term (current) drug therapy: Secondary | ICD-10-CM | POA: Insufficient documentation

## 2023-09-26 DIAGNOSIS — M25762 Osteophyte, left knee: Secondary | ICD-10-CM | POA: Diagnosis not present

## 2023-09-26 HISTORY — PX: TOTAL KNEE ARTHROPLASTY: SHX125

## 2023-09-26 LAB — BASIC METABOLIC PANEL
Anion gap: 9 (ref 5–15)
BUN: 16 mg/dL (ref 8–23)
CO2: 25 mmol/L (ref 22–32)
Calcium: 9 mg/dL (ref 8.9–10.3)
Chloride: 102 mmol/L (ref 98–111)
Creatinine, Ser: 0.85 mg/dL (ref 0.61–1.24)
GFR, Estimated: >60 mL/min (ref >60)
Glucose, Bld: 114 mg/dL — ABNORMAL HIGH (ref 70–99)
Potassium: 3.5 mmol/L (ref 3.5–5.1)
Sodium: 136 mmol/L (ref 135–145)

## 2023-09-26 SURGERY — ARTHROPLASTY, KNEE, TOTAL
Anesthesia: Spinal | Site: Knee | Laterality: Left

## 2023-09-26 MED ORDER — OXYCODONE HCL 5 MG PO TABS: 5.0000 mg | ORAL_TABLET | Freq: Once | ORAL | Status: DC | PRN

## 2023-09-26 MED ORDER — LACTATED RINGERS IV SOLN: INTRAVENOUS | Status: DC

## 2023-09-26 MED ORDER — ACETAMINOPHEN 500 MG PO TABS
1000.0000 mg | ORAL_TABLET | Freq: Once | ORAL | Status: AC
  Administered 2023-09-26: 1000 mg via ORAL
  Filled 2023-09-26: qty 2

## 2023-09-26 MED ORDER — ORAL CARE MOUTH RINSE: 15.0000 mL | Freq: Once | OROMUCOSAL | Status: AC

## 2023-09-26 MED ORDER — PROPOFOL 1000 MG/100ML IV EMUL
INTRAVENOUS | Status: AC
  Filled 2023-09-26: qty 100

## 2023-09-26 MED ORDER — BUPIVACAINE-EPINEPHRINE (PF) 0.5% -1:200000 IJ SOLN
INTRAMUSCULAR | Status: AC
  Filled 2023-09-26: qty 30

## 2023-09-26 MED ORDER — LACTATED RINGERS IV BOLUS
500.0000 mL | Freq: Once | INTRAVENOUS | Status: AC
  Administered 2023-09-26: 500 mL via INTRAVENOUS

## 2023-09-26 MED ORDER — HYDROMORPHONE HCL 1 MG/ML IJ SOLN: 0.5000 mg | INTRAMUSCULAR | Status: DC | PRN

## 2023-09-26 MED ORDER — DEXAMETHASONE SODIUM PHOSPHATE 10 MG/ML IJ SOLN
INTRAMUSCULAR | Status: DC | PRN
  Administered 2023-09-26: 10 mg via INTRAVENOUS

## 2023-09-26 MED ORDER — BUPIVACAINE-EPINEPHRINE (PF) 0.5% -1:200000 IJ SOLN
INTRAMUSCULAR | Status: DC | PRN
  Administered 2023-09-26: 15 mL via PERINEURAL

## 2023-09-26 MED ORDER — LIDOCAINE HCL (PF) 2 % IJ SOLN
INTRAMUSCULAR | Status: AC
  Filled 2023-09-26: qty 5

## 2023-09-26 MED ORDER — CELECOXIB 200 MG PO CAPS: 200.0000 mg | ORAL_CAPSULE | Freq: Two times a day (BID) | ORAL | 0 refills | Status: AC

## 2023-09-26 MED ORDER — 0.9 % SODIUM CHLORIDE (POUR BTL) OPTIME
TOPICAL | Status: DC | PRN
  Administered 2023-09-26: 1000 mL

## 2023-09-26 MED ORDER — TIZANIDINE HCL 2 MG PO TABS: 2.0000 mg | ORAL_TABLET | Freq: Three times a day (TID) | ORAL | 0 refills | Status: AC | PRN

## 2023-09-26 MED ORDER — MIDAZOLAM HCL 2 MG/2ML IJ SOLN
INTRAMUSCULAR | Status: AC
Start: 2023-09-26 — End: ?
  Filled 2023-09-26: qty 2

## 2023-09-26 MED ORDER — TRANEXAMIC ACID-NACL 1000-0.7 MG/100ML-% IV SOLN
1000.0000 mg | Freq: Once | INTRAVENOUS | Status: DC
Start: 2023-09-26 — End: 2023-09-26

## 2023-09-26 MED ORDER — LACTATED RINGERS IV BOLUS
250.0000 mL | Freq: Once | INTRAVENOUS | Status: DC
Start: 2023-09-26 — End: 2023-09-26

## 2023-09-26 MED ORDER — OXYCODONE HCL 5 MG/5ML PO SOLN: 5.0000 mg | Freq: Once | ORAL | Status: DC | PRN

## 2023-09-26 MED ORDER — SODIUM CHLORIDE (PF) 0.9 % IJ SOLN
INTRAMUSCULAR | Status: DC | PRN
  Administered 2023-09-26: 100 mL

## 2023-09-26 MED ORDER — METHOCARBAMOL 1000 MG/10ML IJ SOLN: 500.0000 mg | Freq: Four times a day (QID) | INTRAMUSCULAR | Status: DC | PRN

## 2023-09-26 MED ORDER — BUPIVACAINE LIPOSOME 1.3 % IJ SUSP
INTRAMUSCULAR | Status: AC
  Filled 2023-09-26: qty 20

## 2023-09-26 MED ORDER — TRANEXAMIC ACID-NACL 1000-0.7 MG/100ML-% IV SOLN
1000.0000 mg | INTRAVENOUS | Status: AC
  Administered 2023-09-26: 1000 mg via INTRAVENOUS
  Filled 2023-09-26: qty 100

## 2023-09-26 MED ORDER — WATER FOR IRRIGATION, STERILE IR SOLN
Status: DC | PRN
  Administered 2023-09-26: 1000 mL

## 2023-09-26 MED ORDER — PROPOFOL 10 MG/ML IV BOLUS
INTRAVENOUS | Status: DC | PRN
  Administered 2023-09-26: 100 ug/kg/min via INTRAVENOUS

## 2023-09-26 MED ORDER — ASPIRIN 81 MG PO TBEC: 81.0000 mg | DELAYED_RELEASE_TABLET | Freq: Two times a day (BID) | ORAL | 0 refills | Status: DC

## 2023-09-26 MED ORDER — ONDANSETRON HCL 4 MG/2ML IJ SOLN: 4.0000 mg | Freq: Four times a day (QID) | INTRAMUSCULAR | Status: DC | PRN

## 2023-09-26 MED ORDER — BUPIVACAINE IN DEXTROSE 0.75-8.25 % IT SOLN
INTRATHECAL | Status: DC | PRN
  Administered 2023-09-26: 1.6 mL via INTRATHECAL

## 2023-09-26 MED ORDER — OXYCODONE HCL 5 MG PO TABS: 5.0000 mg | ORAL_TABLET | ORAL | Status: DC | PRN

## 2023-09-26 MED ORDER — SODIUM CHLORIDE 0.9 % IR SOLN
Status: DC | PRN
  Administered 2023-09-26: 1000 mL

## 2023-09-26 MED ORDER — CEFAZOLIN SODIUM-DEXTROSE 2-4 GM/100ML-% IV SOLN
2.0000 g | Freq: Once | INTRAVENOUS | Status: DC
Start: 2023-09-26 — End: 2023-09-26

## 2023-09-26 MED ORDER — DROPERIDOL 2.5 MG/ML IJ SOLN: 0.6250 mg | Freq: Once | INTRAMUSCULAR | Status: DC | PRN

## 2023-09-26 MED ORDER — FENTANYL CITRATE PF 50 MCG/ML IJ SOSY
25.0000 ug | PREFILLED_SYRINGE | INTRAMUSCULAR | Status: DC | PRN
Start: 2023-09-26 — End: 2023-09-26

## 2023-09-26 MED ORDER — CHLORHEXIDINE GLUCONATE 0.12 % MT SOLN
15.0000 mL | Freq: Once | OROMUCOSAL | Status: AC
Start: 2023-09-26 — End: 2023-09-26
  Administered 2023-09-26: 15 mL via OROMUCOSAL

## 2023-09-26 MED ORDER — CEFAZOLIN SODIUM-DEXTROSE 2-4 GM/100ML-% IV SOLN
2.0000 g | INTRAVENOUS | Status: AC
  Administered 2023-09-26: 2 g via INTRAVENOUS
  Filled 2023-09-26: qty 100

## 2023-09-26 MED ORDER — MIDAZOLAM HCL 2 MG/2ML IJ SOLN
INTRAMUSCULAR | Status: DC | PRN
  Administered 2023-09-26 (×2): 1 mg via INTRAVENOUS

## 2023-09-26 MED ORDER — ONDANSETRON HCL 4 MG/2ML IJ SOLN
INTRAMUSCULAR | Status: AC
  Filled 2023-09-26: qty 2

## 2023-09-26 MED ORDER — ONDANSETRON HCL 4 MG PO TABS
4.0000 mg | ORAL_TABLET | Freq: Four times a day (QID) | ORAL | Status: DC | PRN
Start: 2023-09-26 — End: 2023-09-26

## 2023-09-26 MED ORDER — CLONIDINE HCL (ANALGESIA) 100 MCG/ML EP SOLN
EPIDURAL | Status: DC | PRN
  Administered 2023-09-26: 100 ug

## 2023-09-26 MED ORDER — OXYCODONE-ACETAMINOPHEN 5-325 MG PO TABS: 1.0000 | ORAL_TABLET | Freq: Four times a day (QID) | ORAL | 0 refills | Status: AC | PRN

## 2023-09-26 MED ORDER — DEXAMETHASONE SODIUM PHOSPHATE 10 MG/ML IJ SOLN
INTRAMUSCULAR | Status: AC
  Filled 2023-09-26: qty 1

## 2023-09-26 MED ORDER — SODIUM CHLORIDE (PF) 0.9 % IJ SOLN
INTRAMUSCULAR | Status: AC
  Filled 2023-09-26: qty 50

## 2023-09-26 MED ORDER — CELECOXIB 200 MG PO CAPS: 200.0000 mg | ORAL_CAPSULE | Freq: Two times a day (BID) | ORAL | Status: DC

## 2023-09-26 MED ORDER — EPHEDRINE SULFATE (PRESSORS) 50 MG/ML IJ SOLN
INTRAMUSCULAR | Status: DC | PRN
  Administered 2023-09-26: 5 mg via INTRAVENOUS

## 2023-09-26 MED ORDER — PROPOFOL 500 MG/50ML IV EMUL
INTRAVENOUS | Status: AC
  Filled 2023-09-26: qty 50

## 2023-09-26 MED ORDER — PROPOFOL 500 MG/50ML IV EMUL
INTRAVENOUS | Status: DC | PRN
  Administered 2023-09-26: 30 mg via INTRAVENOUS
  Administered 2023-09-26: 50 mg via INTRAVENOUS
  Administered 2023-09-26: 30 mg via INTRAVENOUS

## 2023-09-26 MED ORDER — BUPIVACAINE LIPOSOME 1.3 % IJ SUSP: 20.0000 mL | Freq: Once | INTRAMUSCULAR | Status: DC

## 2023-09-26 MED ORDER — DOCUSATE SODIUM 100 MG PO CAPS
100.0000 mg | ORAL_CAPSULE | Freq: Two times a day (BID) | ORAL | 0 refills | Status: AC
Start: 2023-09-26 — End: ?

## 2023-09-26 MED ORDER — ACETAMINOPHEN 500 MG PO TABS: 1000.0000 mg | ORAL_TABLET | Freq: Four times a day (QID) | ORAL | Status: DC

## 2023-09-26 MED ORDER — PHENYLEPHRINE HCL-NACL 20-0.9 MG/250ML-% IV SOLN
INTRAVENOUS | Status: DC | PRN
  Administered 2023-09-26: 50 ug/min via INTRAVENOUS

## 2023-09-26 MED ORDER — FENTANYL CITRATE (PF) 100 MCG/2ML IJ SOLN
INTRAMUSCULAR | Status: AC
  Filled 2023-09-26: qty 2

## 2023-09-26 MED ORDER — FENTANYL CITRATE (PF) 100 MCG/2ML IJ SOLN
INTRAMUSCULAR | Status: DC | PRN
  Administered 2023-09-26 (×4): 25 ug via INTRAVENOUS

## 2023-09-26 MED ORDER — ONDANSETRON HCL 4 MG/2ML IJ SOLN
INTRAMUSCULAR | Status: DC | PRN
  Administered 2023-09-26: 4 mg via INTRAVENOUS

## 2023-09-26 MED ORDER — METHOCARBAMOL 500 MG PO TABS
500.0000 mg | ORAL_TABLET | Freq: Four times a day (QID) | ORAL | Status: DC | PRN
Start: 2023-09-26 — End: 2023-09-26

## 2023-09-26 MED ORDER — POVIDONE-IODINE 10 % EX SWAB: 2.0000 | Freq: Once | CUTANEOUS | Status: DC

## 2023-09-26 SURGICAL SUPPLY — 52 items
ATTUNE MED DOME PAT 41 KNEE (Knees) IMPLANT
ATTUNE PS FEM LT SZ 8 CEM KNEE (Femur) IMPLANT
ATTUNE PSRP INSR SZ8 6 KNEE (Insert) IMPLANT
BAG COUNTER SPONGE SURGICOUNT (BAG) IMPLANT
BAG ZIPLOCK 12X15 (MISCELLANEOUS) ×1 IMPLANT
BASE TIBIAL ROT PLAT SZ 8 KNEE (Knees) IMPLANT
BENZOIN TINCTURE PRP APPL 2/3 (GAUZE/BANDAGES/DRESSINGS) ×1 IMPLANT
BLADE SAGITTAL 25.0X1.19X90 (BLADE) ×1 IMPLANT
BLADE SAW SGTL 13.0X1.19X90.0M (BLADE) ×1 IMPLANT
BLADE SURG SZ10 CARB STEEL (BLADE) ×2 IMPLANT
BNDG ELASTIC 6INX 5YD STR LF (GAUZE/BANDAGES/DRESSINGS) ×1 IMPLANT
BOOTIES KNEE HIGH SLOAN (MISCELLANEOUS) ×1 IMPLANT
BOWL SMART MIX CTS (DISPOSABLE) ×1 IMPLANT
CEMENT HV SMART SET (Cement) ×2 IMPLANT
COVER SURGICAL LIGHT HANDLE (MISCELLANEOUS) ×1 IMPLANT
CUFF TOURN SGL QUICK 34 (TOURNIQUET CUFF) ×1
CUFF TRNQT CYL 34X4.125X (TOURNIQUET CUFF) ×1 IMPLANT
DRAPE INCISE IOBAN 66X45 STRL (DRAPES) ×1 IMPLANT
DRAPE U-SHAPE 47X51 STRL (DRAPES) ×1 IMPLANT
DRSG AQUACEL AG ADV 3.5X10 (GAUZE/BANDAGES/DRESSINGS) ×1 IMPLANT
DURAPREP 26ML APPLICATOR (WOUND CARE) ×1 IMPLANT
ELECT REM PT RETURN 15FT ADLT (MISCELLANEOUS) ×1 IMPLANT
GLOVE BIOGEL PI IND STRL 8 (GLOVE) ×2 IMPLANT
GLOVE ECLIPSE 7.5 STRL STRAW (GLOVE) ×2 IMPLANT
GOWN STRL REUS W/ TWL XL LVL3 (GOWN DISPOSABLE) ×2 IMPLANT
GOWN STRL REUS W/TWL XL LVL3 (GOWN DISPOSABLE) ×2
HANDPIECE INTERPULSE COAX TIP (DISPOSABLE) ×1
HOLDER FOLEY CATH W/STRAP (MISCELLANEOUS) IMPLANT
HOOD PEEL AWAY T7 (MISCELLANEOUS) ×3 IMPLANT
KIT TURNOVER KIT A (KITS) IMPLANT
MANIFOLD NEPTUNE II (INSTRUMENTS) ×1 IMPLANT
NDL HYPO 22X1.5 SAFETY MO (MISCELLANEOUS) ×2 IMPLANT
NEEDLE HYPO 22X1.5 SAFETY MO (MISCELLANEOUS) ×2 IMPLANT
NS IRRIG 1000ML POUR BTL (IV SOLUTION) ×1 IMPLANT
PACK TOTAL KNEE CUSTOM (KITS) ×1 IMPLANT
PADDING CAST COTTON 6X4 STRL (CAST SUPPLIES) ×1 IMPLANT
PIN STEINMAN FIXATION KNEE (PIN) IMPLANT
PROTECTOR NERVE ULNAR (MISCELLANEOUS) ×1 IMPLANT
SET HNDPC FAN SPRY TIP SCT (DISPOSABLE) ×1 IMPLANT
SPIKE FLUID TRANSFER (MISCELLANEOUS) ×2 IMPLANT
STRIP CLOSURE SKIN 1/2X4 (GAUZE/BANDAGES/DRESSINGS) IMPLANT
SUT MNCRL AB 3-0 PS2 18 (SUTURE) ×1 IMPLANT
SUT VIC AB 0 CT1 36 (SUTURE) ×1 IMPLANT
SUT VIC AB 1 CT1 36 (SUTURE) ×2 IMPLANT
SUT VIC AB 2-0 CT1 27 (SUTURE) ×1
SUT VIC AB 2-0 CT1 TAPERPNT 27 (SUTURE) ×1 IMPLANT
SYR CONTROL 10ML LL (SYRINGE) ×2 IMPLANT
TIBIAL BASE ROT PLAT SZ 8 KNEE (Knees) ×1 IMPLANT
TRAY CATH INTERMITTENT SS 16FR (CATHETERS) IMPLANT
TUBE SUCTION HIGH CAP CLEAR NV (SUCTIONS) ×1 IMPLANT
WATER STERILE IRR 1000ML POUR (IV SOLUTION) ×2 IMPLANT
WRAP KNEE MAXI GEL POST OP (GAUZE/BANDAGES/DRESSINGS) ×1 IMPLANT

## 2023-09-26 NOTE — Anesthesia Postprocedure Evaluation (Signed)
Anesthesia Post Note  Patient: Tracy [name]  Procedure(s) Performed: LEFT TOTAL KNEE ARTHROPLASTY (Left: Knee)     Patient location during evaluation: PACU Anesthesia Type: Spinal Level of consciousness: awake and alert Pain management: pain level controlled Vital Signs Assessment: post-procedure vital signs reviewed and stable Respiratory status: spontaneous breathing, nonlabored ventilation and respiratory function stable Cardiovascular status: blood pressure returned to baseline Postop Assessment: no apparent nausea or vomiting, spinal receding, no headache and no backache Anesthetic complications: no   No notable events documented.  Last Vitals:  Vitals:   09/26/23 1100 09/26/23 1115  BP: 131/84 137/86  Pulse: (!) 109 (!) 110  Resp: 18   Temp:    SpO2: 96% 94%    Last Pain:  Vitals:   09/26/23 1100  TempSrc:   PainSc: 0-No pain                 Shanda Howells

## 2023-09-26 NOTE — Anesthesia Procedure Notes (Signed)
Anesthesia Regional Block: Adductor canal block   Pre-Anesthetic Checklist: , timeout performed,  Correct Patient, Correct Site, Correct Laterality,  Correct Procedure, Correct Position, site marked,  Risks and benefits discussed,  Pre-op evaluation,  At surgeon's request and post-op pain management  Laterality: Left  Prep: Maximum Sterile Barrier Precautions used, chloraprep       Needles:  Injection technique: Single-shot  Needle Type: Echogenic Stimulator Needle     Needle Length: 9cm  Needle Gauge: 22     Additional Needles:   Procedures:,,,, ultrasound used (permanent image in chart),,    Narrative:  Start time: 09/26/2023 6:58 AM End time: 09/26/2023 7:01 AM Injection made incrementally with aspirations every 5 mL.  Performed by: Personally  Anesthesiologist: Kaylyn Layer, MD  Additional Notes: Risks, benefits, and alternative discussed. Patient gave consent for procedure. Patient prepped and draped in sterile fashion. Sedation administered, patient remains easily responsive to voice. Relevant anatomy identified with ultrasound guidance. Local anesthetic given in 5cc increments with no signs or symptoms of intravascular injection. No pain or paraesthesias with injection. Patient monitored throughout procedure with signs of LAST or immediate complications. Tolerated well. Ultrasound image placed in chart.  Amalia Greenhouse, MD

## 2023-09-26 NOTE — Anesthesia Procedure Notes (Signed)
Spinal  Patient location during procedure: OR Start time: 09/26/2023 7:31 AM End time: 09/26/2023 7:34 AM Reason for block: surgical anesthesia Staffing Performed: anesthesiologist  Anesthesiologist: Kaylyn Layer, MD Performed by: Kaylyn Layer, MD Authorized by: Kaylyn Layer, MD   Preanesthetic Checklist Completed: patient identified, IV checked, risks and benefits discussed, surgical consent, monitors and equipment checked, pre-op evaluation and timeout performed Spinal Block Patient position: sitting Prep: DuraPrep and site prepped and draped Patient monitoring: continuous pulse ox, blood pressure and heart rate Approach: midline Location: L3-4 Injection technique: single-shot Needle Needle type: Pencan  Needle gauge: 24 G Needle length: 9 cm Assessment Events: CSF return Additional Notes Risks, benefits, and alternative discussed. Patient gave consent to procedure. Prepped and draped in sitting position. Patient sedated but responsive to voice. Clear CSF obtained after one needle pass. Positive terminal aspiration. No pain or paraesthesias with injection. Patient tolerated procedure well. Vital signs stable. Amalia Greenhouse, MD

## 2023-09-26 NOTE — Transfer of Care (Signed)
Immediate Anesthesia Transfer of Care Note  Patient: Tracy [name]  Procedure(s) Performed: LEFT TOTAL KNEE ARTHROPLASTY (Left: Knee)  Patient Location: PACU  Anesthesia Type:Spinal  Level of Consciousness: awake, alert , oriented, and patient cooperative  Airway & Oxygen Therapy: Patient Spontanous Breathing and Patient connected to face mask oxygen  Post-op Assessment: Report given to RN and Post -op Vital signs reviewed and stable  Post vital signs: Reviewed and stable  Last Vitals:  Vitals Value Taken Time  BP 124/90 09/26/23 0934  Temp    Pulse 104 09/26/23 0935  Resp 19 09/26/23 0935  SpO2 96 % 09/26/23 0935  Vitals shown include unfiled device data.  Last Pain:  Vitals:   09/26/23 0621  TempSrc: Oral  PainSc:          Complications: No notable events documented.

## 2023-09-26 NOTE — Discharge Instructions (Signed)

## 2023-09-26 NOTE — Evaluation (Signed)
Physical Therapy Evaluation Patient Details Name: Tracy [name] MRN: [id number] DOB: May 22, 1956 Today's Date: 09/26/2023  History of Present Illness  67 yo male presents to therapy s/p L TKA on 09/26/2023 due to failure of conservative measures. Pt PMH includes but is not limited to: colon ca s/p partial colectomy, OSA, syncope, bradycardia with pacemaker in situ, HDL and myopia.  Clinical Impression     Shephen Lashlee is a 67 y.o. male POD 0 s/p L TKA. Patient reports IND with mobility at baseline. Patient is now limited by functional impairments (see PT problem list below) and requires CGA and cues for transfers and gait with RW. Patient was able to ambulate 45 and 30 feet with RW and CGA progressing to close S and cues for safe walker management. Patient educated on safe sequencing for stair mobility  with L handrail and SPC, fall risk prevention, use of RW for gait tasks, pain management and goal, use of CP/ice, and car transfers pt and spouse verbalized safe guarding position for people assisting with mobility. Patient instructed in exercises to facilitate ROM and circulation reviewed and HO provided. Patient will benefit from continued skilled PT interventions to address impairments and progress towards PLOF. Patient has met mobility goals at adequate level for discharge home with family support and OPPT services scheduled for 11/20; will continue to follow if pt continues acute stay to progress towards Mod I goals.       If plan is discharge home, recommend the following: A little help with walking and/or transfers;A little help with bathing/dressing/bathroom;Assistance with cooking/housework;Assist for transportation;Help with stairs or ramp for entrance   Can travel by private vehicle        Equipment Recommendations None recommended by PT  Recommendations for Other Services       Functional Status Assessment Patient has had a recent decline in their functional status and demonstrates  the ability to make significant improvements in function in a reasonable and predictable amount of time.     Precautions / Restrictions Precautions Precautions: Knee;Fall;ICD/Pacemaker Restrictions Weight Bearing Restrictions: No      Mobility  Bed Mobility Overal bed mobility: Needs Assistance Bed Mobility: Supine to Sit     Supine to sit: Supervision, HOB elevated, Used rails     General bed mobility comments: min cues    Transfers Overall transfer level: Needs assistance Equipment used: Rolling walker (2 wheels) Transfers: Sit to/from Stand Sit to Stand: Contact guard assist           General transfer comment: min cues for technique and proper AD and LLE placement with commdoe, bed and reclienr transfers    Ambulation/Gait Ambulation/Gait assistance: Contact guard assist Gait Distance (Feet): 45 Feet Assistive device: Rolling walker (2 wheels) Gait Pattern/deviations: Step-to pattern, Trunk flexed, Antalgic Gait velocity: decreased     General Gait Details: step to pattern with min cues for safety and minimal L offloading due to no pain report  Stairs Stairs: Yes Stairs assistance: Contact guard assist Stair Management: One rail Left Number of Stairs: 3 General stair comments: cues for safety, sequencing and technique ascending steps with R LE and B UE support on L handrail, descending steps with L UE support at handrail and R UE with [facility]  Wheelchair Mobility     Tilt Bed    Modified Rankin (Stroke Patients Only)       Balance Overall balance assessment: Needs assistance Sitting-balance support: Feet supported Sitting balance-Leahy Scale: Good     Standing balance support: Bilateral  upper extremity supported, During functional activity, Reliant on assistive device for balance Standing balance-Leahy Scale: Fair Standing balance comment: static standing no UE support and no apparent LOB                             Pertinent  Vitals/Pain Pain Assessment Pain Assessment: 0-10 Pain Score: 0-No pain Pain Location: L knee and leg Pain Descriptors / Indicators:  (no reported pain during PT eval) Pain Intervention(s): Limited activity within patient's tolerance, Monitored during session, Premedicated before session, Repositioned, Ice applied    Home Living Family/patient expects to be discharged to:: Private residence Living Arrangements: Spouse/significant other Available Help at Discharge: Family Type of Home: House Home Access: Stairs to enter Entrance Stairs-Rails: Left Entrance Stairs-Number of Steps: 6   Home Layout: Two level;Able to live on main level with bedroom/bathroom Home Equipment: Rolling Walker (2 wheels);Cane - single point      Prior Function Prior Level of Function : Independent/Modified Independent;Driving             Mobility Comments: IND for all ADLs, self care tasks and IADLs       Extremity/Trunk Assessment        Lower Extremity Assessment Lower Extremity Assessment: LLE deficits/detail LLE Deficits / Details: ankle DF/Fp 5/5;SLR < 10 degrees LLE Sensation: WNL    Cervical / Trunk Assessment Cervical / Trunk Assessment: Normal  Communication   Communication Communication: No apparent difficulties  Cognition Arousal: Alert Behavior During Therapy: WFL for tasks assessed/performed Overall Cognitive Status: Within Functional Limits for tasks assessed                                          General Comments      Exercises Total Joint Exercises Ankle Circles/Pumps: AROM, Both, 15 reps Quad Sets: AROM, Left, 5 reps Short Arc Quad: AROM, Left, 5 reps Heel Slides: AROM, Left, 5 reps Hip ABduction/ADduction: AROM, Left, 5 reps Straight Leg Raises: AROM, Left, 5 reps Knee Flexion: AROM, Left, 5 reps, Seated   Assessment/Plan    PT Assessment Patient needs continued PT services  PT Problem List Decreased strength;Decreased range of  motion;Decreased activity tolerance;Decreased balance;Decreased mobility;Pain       PT Treatment Interventions DME instruction;Gait training;Stair training;Functional mobility training;Therapeutic activities;Therapeutic exercise;Balance training;Neuromuscular re-education;Patient/family education;Modalities    PT Goals (Current goals can be found in the Care Plan section)  Acute Rehab PT Goals Patient Stated Goal: move without pain PT Goal Formulation: With patient Time For Goal Achievement: 10/17/23 Potential to Achieve Goals: Good    Frequency 7X/week     Co-evaluation               AM-PAC PT "6 Clicks" Mobility  Outcome Measure Help needed turning from your back to your side while in a flat bed without using bedrails?: None Help needed moving from lying on your back to sitting on the side of a flat bed without using bedrails?: A Little Help needed moving to and from a bed to a chair (including a wheelchair)?: A Little Help needed standing up from a chair using your arms (e.g., wheelchair or bedside chair)?: A Little Help needed to walk in hospital room?: A Little Help needed climbing 3-5 steps with a railing? : A Little 6 Click Score: 19    End of Session Equipment Utilized During Treatment:  Gait belt Activity Tolerance: Patient tolerated treatment well Patient left: in chair;with call bell/phone within reach;with family/visitor present Nurse Communication: Mobility status;Other (comment) (pt readiness for d/c from PT standpoint) PT Visit Diagnosis: Unsteadiness on feet (R26.81);Muscle weakness (generalized) (M62.81);Other abnormalities of gait and mobility (R26.89);Difficulty in walking, not elsewhere classified (R26.2);Pain Pain - Right/Left:  (no reported LLE pain at time of eval)    Time: 6295-2841 PT Time Calculation (min) (ACUTE ONLY): 39 min   Charges:   PT Evaluation $PT Eval Low Complexity: 1 Low PT Treatments $Gait Training: 8-22 mins $Therapeutic  Exercise: 8-22 mins PT General Charges $$ ACUTE PT VISIT: 1 Visit         Johnny Bridge, PT Acute Rehab   Jacqualyn Posey 09/26/2023, 3:56 PM

## 2023-09-26 NOTE — Op Note (Signed)
PATIENT ID:      Tracy [name]  MRN:     [id number] DOB/AGE:    1956/10/15 / 67 y.o.       OPERATIVE REPORT   DATE OF PROCEDURE:  09/26/2023      PREOPERATIVE DIAGNOSIS:   LEFT KNEE DEGENERATIVE JOINT DISEASE      Estimated body mass index is 27.4 kg/m as calculated from the following:   Height as of this encounter: 6' (1.829 m).   Weight as of this encounter: 91.6 kg.                                                       POSTOPERATIVE DIAGNOSIS:   Same                                                                  PROCEDURE:  Procedure(s): LEFT TOTAL KNEE ARTHROPLASTY Using DepuyAttune RP implants #8 Femur, #8Tibia, 6 mm Attune RP bearing, 41 Patella    SURGEON: Harvie Junior  ASSISTANT:   Gus Puma PA-C   (Present and scrubbed throughout the case, critical for assistance with exposure, retraction, instrumentation, and closure.)        ANESTHESIA: spinal, 20cc Exparel, 50cc 0.25% Marcaine EBL: min cc FLUID REPLACEMENT: unk cc crystaloid TOURNIQUET: DRAINS: None TRANEXAMIC ACID: 1gm IV, 2gm topical COMPLICATIONS:  None         INDICATIONS FOR PROCEDURE: The patient has  LEFT KNEE DEGENERATIVE JOINT DISEASE, varus deformities, XR shows bone on bone arthritis, lateral subluxation of tibia. Patient has failed all conservative measures including anti-inflammatory medicines, narcotics, attempts at exercise and weight loss, cortisone injections and viscosupplementation.  Risks and benefits of surgery have been discussed, questions answered.   DESCRIPTION OF PROCEDURE: The patient identified by armband, received  IV antibiotics, in the holding area at [facility]. Patient taken to the operating room, appropriate anesthetic monitors were attached, and spinal anesthesia was  induced. IV Tranexamic acid was given. Lateral post and 2 surefoot positioners applied to the table, the lower extremity was then prepped and draped in usual sterile fashion from the toes to the high thigh.  Time-out procedure was performed. Gus Puma PAC, was present and scrubbed throughout the case, critical for assistance with, positioning, exposure, retraction, instrumentation, and closure.The skin and subcutaneous tissue along the incision was injected with 20 cc of a mixture of 20cc Exparel and 30cc Marcaine 50cc saline solution, using a 21-gauge by 1-1/2 inch needle. We began the operation, with the knee flexed 130 degrees, by making the anterior midline incision starting at handbreadth above the patella going over the patella 1 cm medial to and 4 cm distal to the tibial tubercle. Small bleeders in the skin and the subcutaneous tissue identified and cauterized. Transverse retinaculum was incised and reflected medially and a medial parapatellar arthrotomy was accomplished. the patella was everted and theprepatellar fat pad resected. The superficial medial collateral ligament was then elevated from anterior to posterior along the proximal flare of the tibia and anterior half of the menisci resected. The knee was hyperflexed exposing bone on bone arthritis. Peripheral  and notch osteophytes as well as the cruciate ligaments were then resected. We continued to work our way around posteriorly along the proximal tibia, and externally rotated the tibia subluxing it out from underneath the femur. A McHale PCL retractor was placed through the notch, a lateral Hohmann retractor, and anterolateral small homan retractor placed. We then entered the proximal tibia with the Depuy starter drill in line with the axis of the tibia followed by an intramedullary guide rod and 3-degree posterior slope cutting guide. The tibial cutting guide, was pinned into place allowing resection of 2 mm of bone medially and 10 mm of bone laterally. Satisfied with the tibial resection, we then entered the distal femur 2 mm anterior to the PCL origin with the starter drill, followed by the intramedullary guide rod and applied the distal femoral  cutting guide set at 9 mm, with 5 degrees of valgus. This was pinned along the epicondylar axis. At this point, the distal femoral cut was accomplished without difficulty. We then sized for a #8 femoral component and pinned the chamfer guide in 3 degrees of external rotation. The anterior, posterior, and chamfer cuts were accomplished without difficulty followed by the Attune RP box cutting guide and the box cut. We also removed posterior osteophytes from the posterior femoral condyles. The posterior capsule was injected with Exparel solution. The knee was brought into full extension. We checked our extension gap and fit a 6 mm trial lollipop. Distracting in extension with a lamina spreader,  bleeders in the posterior capsule, Posterior medial and posterior lateral gutter were cauterized.  The transexamic acid-soaked sponge was then placed in the gap of the knee in extension. The knee was flexed 30. The posterior patella cut was accomplished with the 9.5 mm Attune cutting guide, sized for a 41 mm dome, and the fixation pegs drilled.The knee was then once again hyperflexed exposing the proximal tibia. We sized for a # 8 tibial base plate, applied the smokestack and the conical reamer followed by the the Delta fin keel punch. We then hammered into place the Attune RP trial femoral component, drilled the lugs, inserted a  6 mm trial bearing, trial patellar button, and took the knee through range of motion from 0-130 degrees. Medial and lateral ligamentous stability was checked. No thumb pressure was required for patellar Tracking.  All trial components were removed, mating surfaces irrigated with pulse lavage, and dried with suction and sponges. 10 cc of the Exparel solution was applied to the cancellus bone of the patella distal femur and proximal tibia.  After waiting 30 seconds, the bony surfaces were again, dried with sponges. A double batch of DePuy HV cement was mixed and applied to all bony metallic mating  surfaces except for the posterior condyles of the femur itself. In order, we hammered into place the tibial tray and removed excess cement, the femoral component and removed excess cement. The final Attune RP bearing was inserted, and the knee brought to full extension with compression. The patellar button was clamped into place, and excess cement removed. The knee was held at 30 flexion with compression using the second surefoot, while the cement cured. The wound was irrigated out with normal saline solution pulse lavage. The rest of the Exparel was injected into the parapatellar arthrotomy, subcutaneous tissues, and periosteal tissues. The parapatellar arthrotomy was closed with running #1 Vicryl suture. The subcutaneous tissue with 3-0 undyed Vicryl suture, and the skin with running 3-0 SQ vicryl. An Aquacil dressing and Ace wrap  were applied. The patient was taken to recovery room without difficulty.   Harvie Junior 09/26/2023, 9:04 AM

## 2023-09-27 ENCOUNTER — Encounter (HOSPITAL_COMMUNITY): Payer: Self-pay | Admitting: Orthopedic Surgery

## 2023-09-28 DIAGNOSIS — M542 Cervicalgia: Secondary | ICD-10-CM | POA: Diagnosis not present

## 2023-09-28 DIAGNOSIS — Z4789 Encounter for other orthopedic aftercare: Secondary | ICD-10-CM | POA: Diagnosis not present

## 2023-09-30 DIAGNOSIS — Z4789 Encounter for other orthopedic aftercare: Secondary | ICD-10-CM | POA: Diagnosis not present

## 2023-10-03 DIAGNOSIS — Z4789 Encounter for other orthopedic aftercare: Secondary | ICD-10-CM | POA: Diagnosis not present

## 2023-10-05 DIAGNOSIS — Z4789 Encounter for other orthopedic aftercare: Secondary | ICD-10-CM | POA: Diagnosis not present

## 2023-10-10 DIAGNOSIS — M1712 Unilateral primary osteoarthritis, left knee: Secondary | ICD-10-CM | POA: Diagnosis not present

## 2023-10-11 DIAGNOSIS — Z4789 Encounter for other orthopedic aftercare: Secondary | ICD-10-CM | POA: Diagnosis not present

## 2023-10-12 DIAGNOSIS — Z4789 Encounter for other orthopedic aftercare: Secondary | ICD-10-CM | POA: Diagnosis not present

## 2023-10-14 DIAGNOSIS — Z4789 Encounter for other orthopedic aftercare: Secondary | ICD-10-CM | POA: Diagnosis not present

## 2023-10-17 DIAGNOSIS — Z4789 Encounter for other orthopedic aftercare: Secondary | ICD-10-CM | POA: Diagnosis not present

## 2023-10-19 DIAGNOSIS — Z4789 Encounter for other orthopedic aftercare: Secondary | ICD-10-CM | POA: Diagnosis not present

## 2023-10-24 DIAGNOSIS — Z4789 Encounter for other orthopedic aftercare: Secondary | ICD-10-CM | POA: Diagnosis not present

## 2023-10-26 DIAGNOSIS — Z4789 Encounter for other orthopedic aftercare: Secondary | ICD-10-CM | POA: Diagnosis not present

## 2023-10-31 DIAGNOSIS — Z4789 Encounter for other orthopedic aftercare: Secondary | ICD-10-CM | POA: Diagnosis not present

## 2023-11-03 DIAGNOSIS — Z4789 Encounter for other orthopedic aftercare: Secondary | ICD-10-CM | POA: Diagnosis not present

## 2023-11-07 DIAGNOSIS — Z4789 Encounter for other orthopedic aftercare: Secondary | ICD-10-CM | POA: Diagnosis not present

## 2023-11-10 DIAGNOSIS — Z4789 Encounter for other orthopedic aftercare: Secondary | ICD-10-CM | POA: Diagnosis not present

## 2023-11-16 ENCOUNTER — Ambulatory Visit: Payer: Medicare Other

## 2023-11-16 DIAGNOSIS — I495 Sick sinus syndrome: Secondary | ICD-10-CM | POA: Diagnosis not present

## 2023-11-17 DIAGNOSIS — Z4789 Encounter for other orthopedic aftercare: Secondary | ICD-10-CM | POA: Diagnosis not present

## 2023-11-17 LAB — CUP PACEART REMOTE DEVICE CHECK
Battery Impedance: 2373 Ohm
Battery Remaining Longevity: 36 mo
Battery Voltage: 2.75 V
Brady Statistic AP VP Percent: 23 %
Brady Statistic AP VS Percent: 0 %
Brady Statistic AS VP Percent: 0 %
Brady Statistic AS VS Percent: 77 %
Date Time Interrogation Session: 20250108111658
Implantable Lead Connection Status: 753985
Implantable Lead Connection Status: 753985
Implantable Lead Implant Date: 20040329
Implantable Lead Implant Date: 20040329
Implantable Lead Location: 753859
Implantable Lead Location: 753860
Implantable Lead Model: 4092
Implantable Lead Model: 4592
Implantable Pulse Generator Implant Date: 20120918
Lead Channel Impedance Value: 645 Ohm
Lead Channel Impedance Value: 733 Ohm
Lead Channel Pacing Threshold Amplitude: 1.25 V
Lead Channel Pacing Threshold Pulse Width: 0.4 ms
Lead Channel Setting Pacing Amplitude: 2 V
Lead Channel Setting Pacing Amplitude: 2.5 V
Lead Channel Setting Pacing Pulse Width: 0.4 ms
Lead Channel Setting Sensing Sensitivity: 2 mV
Zone Setting Status: 755011
Zone Setting Status: 755011

## 2023-11-23 DIAGNOSIS — Z4789 Encounter for other orthopedic aftercare: Secondary | ICD-10-CM | POA: Diagnosis not present

## 2023-11-25 DIAGNOSIS — Z4789 Encounter for other orthopedic aftercare: Secondary | ICD-10-CM | POA: Diagnosis not present

## 2023-11-28 DIAGNOSIS — Z4789 Encounter for other orthopedic aftercare: Secondary | ICD-10-CM | POA: Diagnosis not present

## 2023-11-30 DIAGNOSIS — Z4789 Encounter for other orthopedic aftercare: Secondary | ICD-10-CM | POA: Diagnosis not present

## 2023-12-05 DIAGNOSIS — Z4789 Encounter for other orthopedic aftercare: Secondary | ICD-10-CM | POA: Diagnosis not present

## 2023-12-07 DIAGNOSIS — Z4789 Encounter for other orthopedic aftercare: Secondary | ICD-10-CM | POA: Diagnosis not present

## 2023-12-12 DIAGNOSIS — Z4789 Encounter for other orthopedic aftercare: Secondary | ICD-10-CM | POA: Diagnosis not present

## 2023-12-14 DIAGNOSIS — Z4789 Encounter for other orthopedic aftercare: Secondary | ICD-10-CM | POA: Diagnosis not present

## 2023-12-19 DIAGNOSIS — Z4789 Encounter for other orthopedic aftercare: Secondary | ICD-10-CM | POA: Diagnosis not present

## 2023-12-21 DIAGNOSIS — Z4789 Encounter for other orthopedic aftercare: Secondary | ICD-10-CM | POA: Diagnosis not present

## 2023-12-22 DIAGNOSIS — R197 Diarrhea, unspecified: Secondary | ICD-10-CM | POA: Diagnosis not present

## 2023-12-22 DIAGNOSIS — Z85038 Personal history of other malignant neoplasm of large intestine: Secondary | ICD-10-CM | POA: Diagnosis not present

## 2023-12-22 DIAGNOSIS — Z950 Presence of cardiac pacemaker: Secondary | ICD-10-CM | POA: Diagnosis not present

## 2023-12-26 DIAGNOSIS — Z4789 Encounter for other orthopedic aftercare: Secondary | ICD-10-CM | POA: Diagnosis not present

## 2023-12-28 DIAGNOSIS — Z4789 Encounter for other orthopedic aftercare: Secondary | ICD-10-CM | POA: Diagnosis not present

## 2023-12-29 DIAGNOSIS — M25562 Pain in left knee: Secondary | ICD-10-CM | POA: Diagnosis not present

## 2023-12-30 NOTE — Progress Notes (Signed)
 Remote pacemaker transmission.

## 2023-12-30 NOTE — Addendum Note (Signed)
Addended by: Elease Etienne A on: 12/30/2023 08:38 AM   Modules accepted: Orders

## 2024-01-04 ENCOUNTER — Encounter: Payer: Self-pay | Admitting: Internal Medicine

## 2024-01-04 DIAGNOSIS — Z23 Encounter for immunization: Secondary | ICD-10-CM | POA: Diagnosis not present

## 2024-01-04 DIAGNOSIS — I495 Sick sinus syndrome: Secondary | ICD-10-CM | POA: Diagnosis not present

## 2024-01-04 DIAGNOSIS — Z0000 Encounter for general adult medical examination without abnormal findings: Secondary | ICD-10-CM | POA: Diagnosis not present

## 2024-01-04 DIAGNOSIS — I2584 Coronary atherosclerosis due to calcified coronary lesion: Secondary | ICD-10-CM | POA: Diagnosis not present

## 2024-01-04 DIAGNOSIS — I77810 Thoracic aortic ectasia: Secondary | ICD-10-CM | POA: Diagnosis not present

## 2024-01-04 DIAGNOSIS — E785 Hyperlipidemia, unspecified: Secondary | ICD-10-CM | POA: Diagnosis not present

## 2024-01-16 ENCOUNTER — Telehealth: Payer: Self-pay | Admitting: Oncology

## 2024-01-16 NOTE — Telephone Encounter (Signed)
 Left patient a vm regarding upcoming appointment changes. Per [facility] request

## 2024-01-19 DIAGNOSIS — Z85038 Personal history of other malignant neoplasm of large intestine: Secondary | ICD-10-CM | POA: Diagnosis not present

## 2024-01-19 DIAGNOSIS — Z980 Intestinal bypass and anastomosis status: Secondary | ICD-10-CM | POA: Diagnosis not present

## 2024-01-19 DIAGNOSIS — K5730 Diverticulosis of large intestine without perforation or abscess without bleeding: Secondary | ICD-10-CM | POA: Diagnosis not present

## 2024-01-19 DIAGNOSIS — Z08 Encounter for follow-up examination after completed treatment for malignant neoplasm: Secondary | ICD-10-CM | POA: Diagnosis not present

## 2024-01-19 LAB — HM COLONOSCOPY

## 2024-02-01 ENCOUNTER — Inpatient Hospital Stay: Attending: Oncology

## 2024-02-01 ENCOUNTER — Ambulatory Visit: Payer: Medicare Other | Admitting: Oncology

## 2024-02-01 ENCOUNTER — Other Ambulatory Visit: Payer: Self-pay

## 2024-02-01 ENCOUNTER — Inpatient Hospital Stay: Admitting: Oncology

## 2024-02-01 ENCOUNTER — Other Ambulatory Visit: Payer: Medicare Other

## 2024-02-10 ENCOUNTER — Inpatient Hospital Stay: Attending: Oncology

## 2024-02-10 ENCOUNTER — Inpatient Hospital Stay (HOSPITAL_BASED_OUTPATIENT_CLINIC_OR_DEPARTMENT_OTHER): Admitting: Oncology

## 2024-02-10 ENCOUNTER — Encounter: Payer: Self-pay | Admitting: Oncology

## 2024-02-10 VITALS — BP 110/65 | HR 66 | Temp 98.2°F | Resp 18 | Ht 72.0 in | Wt 207.4 lb

## 2024-02-10 DIAGNOSIS — C182 Malignant neoplasm of ascending colon: Secondary | ICD-10-CM | POA: Diagnosis not present

## 2024-02-10 DIAGNOSIS — D509 Iron deficiency anemia, unspecified: Secondary | ICD-10-CM | POA: Insufficient documentation

## 2024-02-10 DIAGNOSIS — Z85038 Personal history of other malignant neoplasm of large intestine: Secondary | ICD-10-CM | POA: Insufficient documentation

## 2024-02-10 DIAGNOSIS — G4730 Sleep apnea, unspecified: Secondary | ICD-10-CM | POA: Insufficient documentation

## 2024-02-10 LAB — CEA (ACCESS): CEA (CHCC): 2.79 ng/mL (ref 0.00–5.00)

## 2024-02-10 NOTE — Progress Notes (Addendum)
 [facility] OFFICE PROGRESS NOTE   Diagnosis: Colon cancer  INTERVAL HISTORY:   Tracy [name] returns as scheduled.  He feels well.  Good appetite.  He is exercising.  He reports frequent bowel movements. He takes a stool softener and uses Imodium as needed for diarrhea.  He reports a negative colonoscopy approximately 2 weeks ago.  No bleeding.  He developed diarrhea when he started a "heart medication". Objective:  Vital signs in last 24 hours:  Blood pressure 110/65, pulse 66, temperature 98.2 F (36.8 C), temperature source Temporal, resp. rate 18, height 6' (1.829 m), weight 207 lb 6.4 oz (94.1 kg), SpO2 98%.    Lymphatics: No cervical, supraclavicular, axillary, or inguinal nodes Resp: Lungs clear bilaterally Cardio: Regular rate and rhythm GI: No hepatosplenomegaly, no mass, nontender Vascular: No leg edema   Lab Results:  Lab Results  Component Value Date   WBC 8.5 09/19/2023   HGB 14.2 09/19/2023   HCT 42.3 09/19/2023   MCV 99.5 09/19/2023   PLT 192 09/19/2023   NEUTROABS 5.7 08/14/2020    CMP  Lab Results  Component Value Date   NA 136 09/26/2023   K 3.5 09/26/2023   CL 102 09/26/2023   CO2 25 09/26/2023   GLUCOSE 114 (H) 09/26/2023   BUN 16 09/26/2023   CREATININE 0.85 09/26/2023   CALCIUM 9.0 09/26/2023   PROT 6.9 06/30/2022   ALBUMIN 4.8 06/30/2022   AST 17 06/30/2022   ALT 14 06/30/2022   ALKPHOS 63 06/30/2022   BILITOT 0.8 06/30/2022   GFRNONAA >60 09/26/2023   GFRAA 105 09/08/2020    Lab Results  Component Value Date   CEA1 2.09 08/18/2021   CEA 2.62 08/04/2023    Medications: I have reviewed the patient's current medications.   Assessment/Plan: Adenocarcinoma the cecum, stage IIb (T4aN0), status post a right colectomy 09/01/2018 0/14 lymph nodes positive, perineural invasion present, no macroscopic tumor perforation, no lymphovascular invasion MSI-stable, no loss of mismatch repair protein expression Colonoscopy  08/08/2018- ulcerated partially obstructing mass at the "hepatic flexure "-biopsy revealed invasive well-differentiated adenocarcinoma, no loss of mismatch repair protein expression, 6 mm polyp in the proximal transverse colon-not removed CTs 08/16/2018- cecal mass, no evidence of metastatic disease Cycle 1 CAPOX 10/09/2018 Cycle 2 CAPOX 10/30/2018 Cycle 3 CAPOX 11/20/2018 Cycle 4 CAPOX 12/11/2018 Surveillance CT scans 08/22/2019-no definitive evidence of metastatic disease.  Stable 4 mm lingular nodule. Surveillance colonoscopy November 2020 CTs 09/16/2020-no metastatic disease CTs 08/18/2021-no evidence of recurrent disease, stable 5 mm left upper lobe nodule Surveillance colonoscopy 12/08/2022-rectal polyp with superficially invasive moderately differentiated adenocarcinoma arising from a tubulovillous adenoma, no LVI, polyp completely removed, margin could not be accurately evaluated, no loss of mismatch repair protein expression Sigmoidoscopy 01/19/2023-no evidence of recurrent disease, rectal biopsy-negative for dysplasia Colonoscopy 01/19/2024: Tattoo and post polypectomy scar in the distal rectum, no evidence of residual polyp tissue   Arrhythmia-status post permanent pacemaker placement Microcytic anemia secondary to #1- resolved Sleep apnea Mild thrombocytopenia secondary to chemotherapy-resolved        Disposition: Mr. Quant is in clinical remission from colon cancer.  We will follow-up on the CEA from today.  He continues endoscopic surveillance after he underwent removal of a rectal polyp containing cancer in January 2024.  He would like to continue follow-up at the Cancer center.  He will return for an office visit in 8 months.  He will try stopping the Colace to see if this helps with the frequent bowel movements and intermittent  diarrhea.  Thornton Papas, MD  02/10/2024  8:19 AM

## 2024-02-13 ENCOUNTER — Encounter: Payer: Self-pay | Admitting: *Deleted

## 2024-02-15 ENCOUNTER — Ambulatory Visit (INDEPENDENT_AMBULATORY_CARE_PROVIDER_SITE_OTHER): Payer: Medicare Other

## 2024-02-15 DIAGNOSIS — I495 Sick sinus syndrome: Secondary | ICD-10-CM

## 2024-02-16 LAB — CUP PACEART REMOTE DEVICE CHECK
Battery Impedance: 2508 Ohm
Battery Remaining Longevity: 35 mo
Battery Voltage: 2.74 V
Brady Statistic AP VP Percent: 25 %
Brady Statistic AP VS Percent: 0 %
Brady Statistic AS VP Percent: 0 %
Brady Statistic AS VS Percent: 75 %
Date Time Interrogation Session: 20250409132935
Implantable Lead Connection Status: 753985
Implantable Lead Connection Status: 753985
Implantable Lead Implant Date: 20040329
Implantable Lead Implant Date: 20040329
Implantable Lead Location: 753859
Implantable Lead Location: 753860
Implantable Lead Model: 4092
Implantable Lead Model: 4592
Implantable Pulse Generator Implant Date: 20120918
Lead Channel Impedance Value: 624 Ohm
Lead Channel Impedance Value: 695 Ohm
Lead Channel Pacing Threshold Amplitude: 1.375 V
Lead Channel Pacing Threshold Pulse Width: 0.4 ms
Lead Channel Setting Pacing Amplitude: 2 V
Lead Channel Setting Pacing Amplitude: 2.75 V
Lead Channel Setting Pacing Pulse Width: 0.4 ms
Lead Channel Setting Sensing Sensitivity: 2.8 mV
Zone Setting Status: 755011
Zone Setting Status: 755011

## 2024-02-25 ENCOUNTER — Encounter: Payer: Self-pay | Admitting: Internal Medicine

## 2024-03-02 DIAGNOSIS — M5137 Other intervertebral disc degeneration, lumbosacral region with discogenic back pain only: Secondary | ICD-10-CM | POA: Diagnosis not present

## 2024-03-02 DIAGNOSIS — M9901 Segmental and somatic dysfunction of cervical region: Secondary | ICD-10-CM | POA: Diagnosis not present

## 2024-03-02 DIAGNOSIS — M9903 Segmental and somatic dysfunction of lumbar region: Secondary | ICD-10-CM | POA: Diagnosis not present

## 2024-03-02 DIAGNOSIS — M5136 Other intervertebral disc degeneration, lumbar region with discogenic back pain only: Secondary | ICD-10-CM | POA: Diagnosis not present

## 2024-03-02 DIAGNOSIS — M9904 Segmental and somatic dysfunction of sacral region: Secondary | ICD-10-CM | POA: Diagnosis not present

## 2024-03-02 DIAGNOSIS — M5032 Other cervical disc degeneration, mid-cervical region, unspecified level: Secondary | ICD-10-CM | POA: Diagnosis not present

## 2024-03-20 DIAGNOSIS — M25562 Pain in left knee: Secondary | ICD-10-CM | POA: Diagnosis not present

## 2024-03-30 NOTE — Progress Notes (Signed)
 Remote pacemaker transmission.

## 2024-03-30 NOTE — Addendum Note (Signed)
 Addended by: Lott Rouleau A on: 03/30/2024 04:16 PM   Modules accepted: Orders

## 2024-06-08 ENCOUNTER — Other Ambulatory Visit: Payer: Self-pay | Admitting: Internal Medicine

## 2024-07-31 ENCOUNTER — Telehealth: Payer: Self-pay

## 2024-07-31 NOTE — Telephone Encounter (Signed)
 The patient was rescheduled to October 18, 2024, due to changes in the provider's office hours. A letter has been sent to the patient regarding this update.

## 2024-08-15 ENCOUNTER — Ambulatory Visit: Payer: Medicare Other

## 2024-08-15 DIAGNOSIS — I495 Sick sinus syndrome: Secondary | ICD-10-CM

## 2024-08-16 LAB — CUP PACEART REMOTE DEVICE CHECK
Battery Impedance: 2616 Ohm
Battery Remaining Longevity: 33 mo
Battery Voltage: 2.73 V
Brady Statistic AP VP Percent: 26 %
Brady Statistic AP VS Percent: 0 %
Brady Statistic AS VP Percent: 0 %
Brady Statistic AS VS Percent: 74 %
Date Time Interrogation Session: 20251008115121
Implantable Lead Connection Status: 753985
Implantable Lead Connection Status: 753985
Implantable Lead Implant Date: 20040329
Implantable Lead Implant Date: 20040329
Implantable Lead Location: 753859
Implantable Lead Location: 753860
Implantable Lead Model: 4092
Implantable Lead Model: 4592
Implantable Pulse Generator Implant Date: 20120918
Lead Channel Impedance Value: 625 Ohm
Lead Channel Impedance Value: 691 Ohm
Lead Channel Pacing Threshold Amplitude: 1.375 V
Lead Channel Pacing Threshold Pulse Width: 0.4 ms
Lead Channel Setting Pacing Amplitude: 2 V
Lead Channel Setting Pacing Amplitude: 2.75 V
Lead Channel Setting Pacing Pulse Width: 0.4 ms
Lead Channel Setting Sensing Sensitivity: 2 mV
Zone Setting Status: 755011
Zone Setting Status: 755011

## 2024-08-17 NOTE — Progress Notes (Signed)
 Remote PPM Transmission

## 2024-08-19 ENCOUNTER — Ambulatory Visit: Payer: Self-pay | Admitting: Cardiology

## 2024-09-04 DIAGNOSIS — M4801 Spinal stenosis, occipito-atlanto-axial region: Secondary | ICD-10-CM | POA: Diagnosis not present

## 2024-09-04 DIAGNOSIS — M9902 Segmental and somatic dysfunction of thoracic region: Secondary | ICD-10-CM | POA: Diagnosis not present

## 2024-09-04 DIAGNOSIS — M9901 Segmental and somatic dysfunction of cervical region: Secondary | ICD-10-CM | POA: Diagnosis not present

## 2024-09-04 DIAGNOSIS — M5414 Radiculopathy, thoracic region: Secondary | ICD-10-CM | POA: Diagnosis not present

## 2024-10-12 ENCOUNTER — Inpatient Hospital Stay

## 2024-10-12 ENCOUNTER — Ambulatory Visit: Admitting: Oncology

## 2024-10-18 ENCOUNTER — Inpatient Hospital Stay: Attending: Oncology

## 2024-10-18 ENCOUNTER — Inpatient Hospital Stay (HOSPITAL_BASED_OUTPATIENT_CLINIC_OR_DEPARTMENT_OTHER): Admitting: Oncology

## 2024-10-18 VITALS — BP 108/72 | HR 65 | Temp 97.8°F | Resp 18 | Ht 72.0 in | Wt 211.5 lb

## 2024-10-18 DIAGNOSIS — D509 Iron deficiency anemia, unspecified: Secondary | ICD-10-CM | POA: Insufficient documentation

## 2024-10-18 DIAGNOSIS — Z950 Presence of cardiac pacemaker: Secondary | ICD-10-CM | POA: Insufficient documentation

## 2024-10-18 DIAGNOSIS — G4730 Sleep apnea, unspecified: Secondary | ICD-10-CM | POA: Insufficient documentation

## 2024-10-18 DIAGNOSIS — Z85038 Personal history of other malignant neoplasm of large intestine: Secondary | ICD-10-CM | POA: Insufficient documentation

## 2024-10-18 DIAGNOSIS — Z9049 Acquired absence of other specified parts of digestive tract: Secondary | ICD-10-CM | POA: Insufficient documentation

## 2024-10-18 DIAGNOSIS — I499 Cardiac arrhythmia, unspecified: Secondary | ICD-10-CM | POA: Insufficient documentation

## 2024-10-18 DIAGNOSIS — C182 Malignant neoplasm of ascending colon: Secondary | ICD-10-CM | POA: Diagnosis not present

## 2024-10-18 LAB — CEA (ACCESS): CEA (CHCC): 2.86 ng/mL (ref 0.00–5.00)

## 2024-10-18 NOTE — Progress Notes (Signed)
°  [facility] OFFICE PROGRESS NOTE   Diagnosis: Colon cancer  INTERVAL HISTORY:   Tracy [name] returns as scheduled.  He feels well.  He exercises 3 days/week.  No rectal bleeding.  Objective:  Vital signs in last 24 hours:  Blood pressure 108/72, pulse 65, temperature 97.8 F (36.6 C), temperature source Temporal, resp. rate 18, height 6' (1.829 m), weight 211 lb 8 oz (95.9 kg), SpO2 98%.    Lymphatics: No cervical, supraclavicular, axillary, or inguinal nodes Resp: Lungs clear bilaterally Cardio: Regular rate and rhythm with grouped beats GI: No hepatosplenomegaly, nontender, no mass Vascular: No leg edema   Lab Results:  Lab Results  Component Value Date   WBC 8.5 09/19/2023   HGB 14.2 09/19/2023   HCT 42.3 09/19/2023   MCV 99.5 09/19/2023   PLT 192 09/19/2023   NEUTROABS 5.7 08/14/2020    CMP  Lab Results  Component Value Date   NA 136 09/26/2023   K 3.5 09/26/2023   CL 102 09/26/2023   CO2 25 09/26/2023   GLUCOSE 114 (H) 09/26/2023   BUN 16 09/26/2023   CREATININE 0.85 09/26/2023   CALCIUM  9.0 09/26/2023   PROT 6.9 06/30/2022   ALBUMIN  4.8 06/30/2022   AST 17 06/30/2022   ALT 14 06/30/2022   ALKPHOS 63 06/30/2022   BILITOT 0.8 06/30/2022   GFRNONAA >60 09/26/2023   GFRAA 105 09/08/2020    Lab Results  Component Value Date   CEA1 2.09 08/18/2021   CEA 2.86 10/18/2024    Lab Results  Component Value Date   INR 0.95 07/27/2011   LABPROT 12.9 07/27/2011    Imaging:  No results found.  Medications: I have reviewed the patient's current medications.   Assessment/Plan:  Adenocarcinoma the cecum, stage IIb (T4aN0), status post a right colectomy 09/01/2018 0/14 lymph nodes positive, perineural invasion present, no macroscopic tumor perforation, no lymphovascular invasion MSI-stable, no loss of mismatch repair protein expression Colonoscopy 08/08/2018- ulcerated partially obstructing mass at the hepatic flexure -biopsy  revealed invasive well-differentiated adenocarcinoma, no loss of mismatch repair protein expression, 6 mm polyp in the proximal transverse colon-not removed CTs 08/16/2018- cecal mass, no evidence of metastatic disease Cycle 1 CAPOX 10/09/2018 Cycle 2 CAPOX 10/30/2018 Cycle 3 CAPOX 11/20/2018 Cycle 4 CAPOX 12/11/2018 Surveillance CT scans 08/22/2019-no definitive evidence of metastatic disease.  Stable 4 mm lingular nodule. Surveillance colonoscopy November 2020 CTs 09/16/2020-no metastatic disease CTs 08/18/2021-no evidence of recurrent disease, stable 5 mm left upper lobe nodule Surveillance colonoscopy 12/08/2022-rectal polyp with superficially invasive moderately differentiated adenocarcinoma arising from a tubulovillous adenoma, no LVI, polyp completely removed, margin could not be accurately evaluated, no loss of mismatch repair protein expression Sigmoidoscopy 01/19/2023-no evidence of recurrent disease, rectal biopsy-negative for dysplasia Colonoscopy 01/19/2024: Tattoo and post polypectomy scar in the distal rectum, no evidence of residual polyp tissue   Arrhythmia-status post permanent pacemaker placement Microcytic anemia secondary to #1- resolved Sleep apnea Mild thrombocytopenia secondary to chemotherapy-resolved       Disposition: Tracy [name] remains in clinical remission from colon cancer.  He will continue colonoscopy surveillance with Dr. Burnette after removal of the polyp containing adenocarcinoma in 2024.  Tracy [name] would like to continue follow-up in the oncology clinic.  He will return for an office visit in 6 months.  Tracy Hof, MD  10/18/2024  12:18 PM

## 2024-11-14 ENCOUNTER — Ambulatory Visit

## 2024-11-14 DIAGNOSIS — I495 Sick sinus syndrome: Secondary | ICD-10-CM

## 2024-11-19 LAB — CUP PACEART REMOTE DEVICE CHECK
Battery Impedance: 2678 Ohm
Battery Remaining Longevity: 32 mo
Battery Voltage: 2.73 V
Brady Statistic AP VP Percent: 27 %
Brady Statistic AP VS Percent: 0 %
Brady Statistic AS VP Percent: 0 %
Brady Statistic AS VS Percent: 73 %
Date Time Interrogation Session: 20260112112205
Implantable Lead Connection Status: 753985
Implantable Lead Connection Status: 753985
Implantable Lead Implant Date: 20040329
Implantable Lead Implant Date: 20040329
Implantable Lead Location: 753859
Implantable Lead Location: 753860
Implantable Lead Model: 4092
Implantable Lead Model: 4592
Implantable Pulse Generator Implant Date: 20120918
Lead Channel Impedance Value: 622 Ohm
Lead Channel Impedance Value: 727 Ohm
Lead Channel Pacing Threshold Amplitude: 1.25 V
Lead Channel Pacing Threshold Pulse Width: 0.4 ms
Lead Channel Setting Pacing Amplitude: 2 V
Lead Channel Setting Pacing Amplitude: 2.75 V
Lead Channel Setting Pacing Pulse Width: 0.4 ms
Lead Channel Setting Sensing Sensitivity: 2.8 mV
Zone Setting Status: 755011
Zone Setting Status: 755011

## 2024-11-20 ENCOUNTER — Ambulatory Visit: Payer: Self-pay | Admitting: Cardiology

## 2024-11-20 NOTE — Progress Notes (Signed)
 Remote PPM Transmission

## 2025-02-13 ENCOUNTER — Encounter

## 2025-04-18 ENCOUNTER — Inpatient Hospital Stay

## 2025-04-18 ENCOUNTER — Inpatient Hospital Stay: Admitting: Oncology

## 2025-05-15 ENCOUNTER — Encounter

## 2025-08-14 ENCOUNTER — Encounter
